# Patient Record
Sex: Female | Born: 1965
Health system: Southern US, Community
[De-identification: ages and names within clinical notes are randomized; demographics above are authoritative.]

## PROBLEM LIST (undated history)

## (undated) DIAGNOSIS — T8859XA Other complications of anesthesia, initial encounter: Secondary | ICD-10-CM

## (undated) DIAGNOSIS — F419 Anxiety disorder, unspecified: Secondary | ICD-10-CM

## (undated) DIAGNOSIS — I10 Essential (primary) hypertension: Secondary | ICD-10-CM

## (undated) DIAGNOSIS — T7840XA Allergy, unspecified, initial encounter: Secondary | ICD-10-CM

## (undated) DIAGNOSIS — Z1509 Genetic susceptibility to other malignant neoplasm: Secondary | ICD-10-CM

## (undated) DIAGNOSIS — T4145XA Adverse effect of unspecified anesthetic, initial encounter: Secondary | ICD-10-CM

## (undated) DIAGNOSIS — K219 Gastro-esophageal reflux disease without esophagitis: Secondary | ICD-10-CM

## (undated) DIAGNOSIS — R51 Headache: Secondary | ICD-10-CM

## (undated) DIAGNOSIS — Z8041 Family history of malignant neoplasm of ovary: Secondary | ICD-10-CM

## (undated) DIAGNOSIS — R519 Headache, unspecified: Secondary | ICD-10-CM

## (undated) DIAGNOSIS — M1990 Unspecified osteoarthritis, unspecified site: Secondary | ICD-10-CM

## (undated) HISTORY — DX: Family history of malignant neoplasm of ovary: Z80.41

## (undated) HISTORY — DX: Gastro-esophageal reflux disease without esophagitis: K21.9

## (undated) HISTORY — PX: GALLBLADDER SURGERY: SHX652

## (undated) HISTORY — DX: Allergy, unspecified, initial encounter: T78.40XA

## (undated) HISTORY — DX: Genetic susceptibility to other malignant neoplasm: Z15.09

---
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
## 2007-10-19 ENCOUNTER — Ambulatory Visit: Payer: Self-pay | Admitting: Family Medicine

## 2007-10-31 ENCOUNTER — Ambulatory Visit: Payer: Self-pay | Admitting: Family Medicine

## 2011-02-25 ENCOUNTER — Encounter (INDEPENDENT_AMBULATORY_CARE_PROVIDER_SITE_OTHER): Payer: BC Managed Care – PPO | Admitting: Ophthalmology

## 2011-02-25 DIAGNOSIS — H43819 Vitreous degeneration, unspecified eye: Secondary | ICD-10-CM

## 2011-02-25 DIAGNOSIS — H3530 Unspecified macular degeneration: Secondary | ICD-10-CM

## 2016-02-11 ENCOUNTER — Encounter: Payer: Self-pay | Admitting: Family Medicine

## 2016-03-23 ENCOUNTER — Encounter: Payer: Self-pay | Admitting: Family Medicine

## 2016-04-07 ENCOUNTER — Encounter: Payer: Self-pay | Admitting: Family Medicine

## 2016-05-25 ENCOUNTER — Encounter: Payer: Self-pay | Admitting: Emergency Medicine

## 2016-05-25 ENCOUNTER — Emergency Department: Payer: Commercial Managed Care - HMO

## 2016-05-25 ENCOUNTER — Other Ambulatory Visit: Payer: Self-pay | Admitting: Radiology

## 2016-05-25 ENCOUNTER — Emergency Department
Admission: EM | Admit: 2016-05-25 | Discharge: 2016-05-25 | Disposition: A | Discharge status: Home / Self Care | Payer: Commercial Managed Care - HMO | Attending: Student in an Organized Health Care Education/Training Program | Admitting: Student in an Organized Health Care Education/Training Program

## 2016-05-25 DIAGNOSIS — R1011 Right upper quadrant pain: Secondary | ICD-10-CM | POA: Diagnosis present

## 2016-05-25 DIAGNOSIS — K8020 Calculus of gallbladder without cholecystitis without obstruction: Secondary | ICD-10-CM | POA: Diagnosis not present

## 2016-05-25 LAB — CBC WITH DIFFERENTIAL/PLATELET
BASOS ABS: 0.1 10*3/uL (ref 0–0.1)
BASOS PCT: 0 %
EOS PCT: 0 %
Eosinophils Absolute: 0 10*3/uL (ref 0–0.7)
HCT: 39.1 % (ref 35.0–47.0)
Hemoglobin: 13.4 g/dL (ref 12.0–16.0)
LYMPHS PCT: 6 %
Lymphs Abs: 1 10*3/uL (ref 1.0–3.6)
MCH: 29.6 pg (ref 26.0–34.0)
MCHC: 34.2 g/dL (ref 32.0–36.0)
MCV: 86.4 fL (ref 80.0–100.0)
Monocytes Absolute: 0.4 10*3/uL (ref 0.2–0.9)
Monocytes Relative: 2 %
NEUTROS ABS: 15.6 10*3/uL — AB (ref 1.4–6.5)
Neutrophils Relative %: 92 %
PLATELETS: 261 10*3/uL (ref 150–440)
RBC: 4.52 MIL/uL (ref 3.80–5.20)
RDW: 14.1 % (ref 11.5–14.5)
WBC: 17.1 10*3/uL — AB (ref 3.6–11.0)

## 2016-05-25 LAB — URINALYSIS, COMPLETE (UACMP) WITH MICROSCOPIC
Bilirubin Urine: NEGATIVE
GLUCOSE, UA: NEGATIVE mg/dL
KETONES UR: 20 mg/dL — AB
LEUKOCYTES UA: NEGATIVE
NITRITE: NEGATIVE
PROTEIN: NEGATIVE mg/dL
Specific Gravity, Urine: 1.01 (ref 1.005–1.030)
pH: 6 (ref 5.0–8.0)

## 2016-05-25 LAB — COMPREHENSIVE METABOLIC PANEL
ALT: 15 U/L (ref 14–54)
ANION GAP: 11 (ref 5–15)
AST: 20 U/L (ref 15–41)
Albumin: 3.7 g/dL (ref 3.5–5.0)
Alkaline Phosphatase: 72 U/L (ref 38–126)
BUN: <5 mg/dL — ABNORMAL LOW (ref 6–20)
CHLORIDE: 96 mmol/L — AB (ref 101–111)
CO2: 24 mmol/L (ref 22–32)
Calcium: 9 mg/dL (ref 8.9–10.3)
Creatinine, Ser: 0.5 mg/dL (ref 0.44–1.00)
GFR calc non Af Amer: >60 mL/min (ref >60)
Glucose, Bld: 156 mg/dL — ABNORMAL HIGH (ref 65–99)
POTASSIUM: 3.3 mmol/L — AB (ref 3.5–5.1)
SODIUM: 131 mmol/L — AB (ref 135–145)
Total Bilirubin: 0.7 mg/dL (ref 0.3–1.2)
Total Protein: 8.2 g/dL — ABNORMAL HIGH (ref 6.5–8.1)

## 2016-05-25 LAB — LIPASE, BLOOD: Lipase: 15 U/L (ref 11–51)

## 2016-05-25 MED ORDER — MORPHINE SULFATE (PF) 4 MG/ML IV SOLN
4.0000 mg | INTRAVENOUS | Status: DC | PRN
Start: 2016-05-25 — End: 2016-05-25
  Filled 2016-05-25: qty 1

## 2016-05-25 MED ORDER — PROMETHAZINE HCL 25 MG/ML IJ SOLN
12.5000 mg | Freq: Once | INTRAMUSCULAR | Status: DC
  Filled 2016-05-25: qty 1

## 2016-05-25 MED ORDER — SODIUM CHLORIDE 0.9 % IV BOLUS (SEPSIS)
1000.0000 mL | Freq: Once | INTRAVENOUS | Status: AC
  Administered 2016-05-25: 1000 mL via INTRAVENOUS

## 2016-05-25 MED ORDER — PROMETHAZINE HCL 12.5 MG PO TABS: 12.5000 mg | ORAL_TABLET | Freq: Four times a day (QID) | ORAL | 0 refills | Status: DC | PRN

## 2016-05-25 MED ORDER — DICYCLOMINE HCL 10 MG PO CAPS
10.0000 mg | ORAL_CAPSULE | Freq: Once | ORAL | Status: AC
  Administered 2016-05-25: 10 mg via ORAL
  Filled 2016-05-25: qty 1

## 2016-05-25 MED ORDER — DICYCLOMINE HCL 10 MG PO CAPS: 10.0000 mg | ORAL_CAPSULE | Freq: Three times a day (TID) | ORAL | 0 refills | Status: DC | PRN

## 2016-05-25 MED ORDER — HYDROCODONE-ACETAMINOPHEN 5-325 MG PO TABS: 1.0000 | ORAL_TABLET | ORAL | 0 refills | Status: DC | PRN

## 2016-05-25 NOTE — ED Notes (Signed)
Pt ambulatory to toilet with no assistance to urinate.

## 2016-05-25 NOTE — ED Notes (Signed)
Pt given crackers, peanut butter, and water for PO challenge.

## 2016-05-25 NOTE — ED Notes (Signed)
Pt states RUQ pain since midnight. States N&V but no diarrhea. States not keeping anything down, was seen at [facility] and sent here. Pt states she has gallbladder and pancreas, but no appendix. Describes pain as wrapping around R breast to back. Pt is alert and oriented, no distress noted. States UC gave pain medicine and phenergan.

## 2016-05-25 NOTE — ED Notes (Signed)
Pt denied wanting pain or nausea medicine at this time since she received some at [facility].

## 2016-05-25 NOTE — ED Notes (Signed)
Pt taken to US via stretcher

## 2016-05-25 NOTE — ED Provider Notes (Signed)
[facility] Emergency Department Provider Note    First MD Initiated Contact with Patient 05/25/16 1639     (approximate)  I have reviewed the triage vital signs and the nursing notes.   HISTORY  Chief Complaint Abdominal Pain    HPI Hailey Sheppard is a 50 y.o. female no significant past medical history presents with acute right upper quadrant pain that started around noon today after she ate several sausages for lunch. The pain she rated as 10 out 10 in severity starting just under her right ribs and radiating to her back. There is no associated shortness of breath or chest pain. States that she's vomited innumerable times. No blood or bile in her vomit. States that she's never had this pain before. Is not having any abdominal surgeries. Denies any diarrhea or constipation. No history of kidney stones. Denies any hematuria or dysuria. She is not on any blood thinners. Does have a family history of cholecystitis with lithiasis on her mother's side.   History reviewed. No pertinent past medical history. History reviewed. No pertinent family history. No previous surgeries There are no active problems to display for this patient.     Prior to Admission medications   Medication Sig Start Date End Date Taking? Authorizing Provider  montelukast (SINGULAIR) 10 MG tablet Take 10 mg by mouth daily as needed.   Yes Historical Provider, MD  PSEUDOEPHEDRINE HCL PO Take 1 tablet by mouth daily.   Yes Historical Provider, MD  dicyclomine (BENTYL) 10 MG capsule Take 1 capsule (10 mg total) by mouth 3 (three) times daily as needed for spasms. 05/25/16 06/08/16  Merlyn Lot, MD  HYDROcodone-acetaminophen (NORCO) 5-325 MG tablet Take 1 tablet by mouth every 4 (four) hours as needed for moderate pain. 05/25/16   Merlyn Lot, MD  promethazine (PHENERGAN) 12.5 MG tablet Take 1 tablet (12.5 mg total) by mouth every 6 (six) hours as needed for nausea or vomiting. 05/25/16    Merlyn Lot, MD    Allergies Erythromycin    Social History Social History  Substance Use Topics  . Smoking status: Not on file  . Smokeless tobacco: Not on file  . Alcohol use Not on file    Review of Systems Patient denies headaches, rhinorrhea, blurry vision, numbness, shortness of breath, chest pain, edema, cough, abdominal pain, nausea, vomiting, diarrhea, dysuria, fevers, rashes or hallucinations unless otherwise stated above in HPI. ____________________________________________   PHYSICAL EXAM:  VITAL SIGNS: Vitals:   05/25/16 1800 05/25/16 1830  BP: 135/75 (!) 141/80  Pulse: 69 85  Resp: 20 19  Temp:      Constitutional: Alert and oriented.in no acute distress. Eyes: Conjunctivae are normal. PERRL. EOMI. Head: Atraumatic. Nose: No congestion/rhinnorhea. Mouth/Throat: Mucous membranes are moist.  Oropharynx non-erythematous. Neck: No stridor. Painless ROM. No cervical spine tenderness to palpation Hematological/Lymphatic/Immunilogical: No cervical lymphadenopathy. Cardiovascular: Normal rate, regular rhythm. Grossly normal heart sounds.  Good peripheral circulation. Respiratory: Normal respiratory effort.  No retractions. Lungs CTAB. Gastrointestinal: Soft with RUQ ttp, no guarding. No distention. No abdominal bruits. No CVA tenderness. Musculoskeletal: No lower extremity tenderness nor edema.  No joint effusions. Neurologic:  Normal speech and language. No gross focal neurologic deficits are appreciated. No gait instability. Skin:  Skin is warm, dry and intact. No rash noted. Psychiatric: Mood and affect are normal. Speech and behavior are normal.  ____________________________________________   LABS (all labs ordered are listed, but only abnormal results are displayed)  Results for orders placed or performed  during the hospital encounter of 05/25/16 (from the past 24 hour(s))  CBC with Differential/Platelet     Status: Abnormal   Collection Time:  05/25/16  4:56 PM  Result Value Ref Range   WBC 17.1 (H) 3.6 - 11.0 K/uL   RBC 4.52 3.80 - 5.20 MIL/uL   Hemoglobin 13.4 12.0 - 16.0 g/dL   HCT 39.1 35.0 - 47.0 %   MCV 86.4 80.0 - 100.0 fL   MCH 29.6 26.0 - 34.0 pg   MCHC 34.2 32.0 - 36.0 g/dL   RDW 14.1 11.5 - 14.5 %   Platelets 261 150 - 440 K/uL   Neutrophils Relative % 92 %   Neutro Abs 15.6 (H) 1.4 - 6.5 K/uL   Lymphocytes Relative 6 %   Lymphs Abs 1.0 1.0 - 3.6 K/uL   Monocytes Relative 2 %   Monocytes Absolute 0.4 0.2 - 0.9 K/uL   Eosinophils Relative 0 %   Eosinophils Absolute 0.0 0 - 0.7 K/uL   Basophils Relative 0 %   Basophils Absolute 0.1 0 - 0.1 K/uL  Comprehensive metabolic panel     Status: Abnormal   Collection Time: 05/25/16  4:56 PM  Result Value Ref Range   Sodium 131 (L) 135 - 145 mmol/L   Potassium 3.3 (L) 3.5 - 5.1 mmol/L   Chloride 96 (L) 101 - 111 mmol/L   CO2 24 22 - 32 mmol/L   Glucose, Bld 156 (H) 65 - 99 mg/dL   BUN <5 (L) 6 - 20 mg/dL   Creatinine, Ser 0.50 0.44 - 1.00 mg/dL   Calcium 9.0 8.9 - 10.3 mg/dL   Total Protein 8.2 (H) 6.5 - 8.1 g/dL   Albumin 3.7 3.5 - 5.0 g/dL   AST 20 15 - 41 U/L   ALT 15 14 - 54 U/L   Alkaline Phosphatase 72 38 - 126 U/L   Total Bilirubin 0.7 0.3 - 1.2 mg/dL   GFR calc non Af Amer >60 >60 mL/min   GFR calc Af Amer >60 >60 mL/min   Anion gap 11 5 - 15  Lipase, blood     Status: None   Collection Time: 05/25/16  4:56 PM  Result Value Ref Range   Lipase 15 11 - 51 U/L  Urinalysis, Complete w Microscopic     Status: Abnormal   Collection Time: 05/25/16  4:56 PM  Result Value Ref Range   Color, Urine YELLOW (A) YELLOW   APPearance CLEAR (A) CLEAR   Specific Gravity, Urine 1.010 1.005 - 1.030   pH 6.0 5.0 - 8.0   Glucose, UA NEGATIVE NEGATIVE mg/dL   Hgb urine dipstick SMALL (A) NEGATIVE   Bilirubin Urine NEGATIVE NEGATIVE   Ketones, ur 20 (A) NEGATIVE mg/dL   Protein, ur NEGATIVE NEGATIVE mg/dL   Nitrite NEGATIVE NEGATIVE   Leukocytes, UA NEGATIVE  NEGATIVE   RBC / HPF 0-5 0 - 5 RBC/hpf   WBC, UA 0-5 0 - 5 WBC/hpf   Bacteria, UA RARE (A) NONE SEEN   Squamous Epithelial / LPF 0-5 (A) NONE SEEN   Mucous PRESENT    ____________________________________________  EKG My review and personal interpretation at Time: 16:39   Indication: ruq pain  Rate: 70  Rhythm: sinus Axis: normal Other: non specific t wave changes, no STEMI ____________________________________________  RADIOLOGY  I personally reviewed all radiographic images ordered to evaluate for the above acute complaints and reviewed radiology reports and findings.  These findings were personally discussed with the patient.  Please  see medical record for radiology report.  ____________________________________________   PROCEDURES  Procedure(s) performed:  Procedures    Critical Care performed: no ____________________________________________   INITIAL IMPRESSION / ASSESSMENT AND PLAN / ED COURSE  Pertinent labs & imaging results that were available during my care of the patient were reviewed by me and considered in my medical decision making (see chart for details).  DDX: cholelithiasis, cholecystitis, biliary colic, enteritis, pancreatitis  Hailey Sheppard is a 50 y.o. who presents to the ED with acute right upper quadrant pain with nausea and vomiting. Presentation is concerning for hepatobiliary pathology therefore right upper quadrant ultrasound be ordered. EKG shows no evidence of acute ischemia. Her pain is now moderately controlled after receiving pain medication from [facility]. We'll provide IV fluids for dehydration as well as check labs for any evidence of biliary obstructive pathology.  Clinical Course as of May 25 1957  Mon May 25, 2016  1737 Blood work is fairly reassuring. Patient does have a mild leukocytosis but is afebrile. Patient reassessed and her abdominal exam is benign at this time. Ultrasound does show evidence of cholelithiasis without evidence of  acute cholecystitis. Her LFTs and hemodynamically reviewed and are normal. I reviewed the case with Dr. Burt Knack of general surgery. He is recommended additional symptomatically control. If able to tolerate oral hydration and pain is controlled patient can be seen in clinic early this week for possible operative planning. Do suspect she is having some component of biliary colic. We'll provide additional oral analgesia and PO challenge.  [PR]  1851 Patient tolerating PO.  In no discomfort at this time.  Patient was able to tolerate PO and was able to ambulate with a steady gait.   [PR]  [id number] Patient requesting discharge home. Patient remains in no acute distress. Does have some ketones on urinalysis likely secondary to nausea vomiting and component of dehydration. Remains afebrile. She is tolerating hydration and food. She denies any pain or nausea at this time. Will discharge with strict return precautions, follow up with Dr. Antionette Char clinic as well as antiemetics and pain medication.  [PR]    Clinical Course User Index [PR] Merlyn Lot, MD     ____________________________________________   FINAL CLINICAL IMPRESSION(S) / ED DIAGNOSES  Final diagnoses:  RUQ abdominal pain  Calculus of gallbladder without cholecystitis without obstruction      NEW MEDICATIONS STARTED DURING THIS VISIT:  Discharge Medication List as of 05/25/2016  6:58 PM    START taking these medications   Details  dicyclomine (BENTYL) 10 MG capsule Take 1 capsule (10 mg total) by mouth 3 (three) times daily as needed for spasms., Starting Mon 05/25/2016, Until Mon 06/08/2016, Print    HYDROcodone-acetaminophen (NORCO) 5-325 MG tablet Take 1 tablet by mouth every 4 (four) hours as needed for moderate pain., Starting Mon 05/25/2016, Print    promethazine (PHENERGAN) 12.5 MG tablet Take 1 tablet (12.5 mg total) by mouth every 6 (six) hours as needed for nausea or vomiting., Starting Mon 05/25/2016, Print          Note:  This document was prepared using Dragon voice recognition software and may include unintentional dictation errors.    Merlyn Lot, MD 05/25/16 [phone]

## 2016-05-25 NOTE — ED Triage Notes (Signed)
States upper right abd pain that began at 12 am, states pain that radiates to her back, states nausea and vomiting, pt awake and alert, was sent from urgent care

## 2016-05-27 DIAGNOSIS — T7840XA Allergy, unspecified, initial encounter: Secondary | ICD-10-CM | POA: Insufficient documentation

## 2016-05-27 DIAGNOSIS — K219 Gastro-esophageal reflux disease without esophagitis: Secondary | ICD-10-CM | POA: Insufficient documentation

## 2016-05-29 ENCOUNTER — Ambulatory Visit (INDEPENDENT_AMBULATORY_CARE_PROVIDER_SITE_OTHER): Payer: Commercial Managed Care - HMO | Admitting: Surgery

## 2016-05-29 ENCOUNTER — Encounter: Payer: Self-pay | Admitting: Surgery

## 2016-05-29 VITALS — BP 159/100 | HR 98 | Temp 99.0°F | Ht 62.0 in | Wt 229.2 lb

## 2016-05-29 DIAGNOSIS — K8050 Calculus of bile duct without cholangitis or cholecystitis without obstruction: Secondary | ICD-10-CM | POA: Diagnosis not present

## 2016-05-29 NOTE — Patient Instructions (Addendum)
You have requested to have your gallbladder removed. We will arrange for this to be done on 06/17/16 at [facility] with Dr. Burt Knack.  You will most likely be out of work 1-2 weeks for this surgery. You will return after your post-op appointment with a lifting restriction for approximately 4 more weeks.  You will be able to eat anything you would like to following surgery. But, start by eating a bland diet and advance this as tolerated.  Please see the (blue)pre-care form that you have been given today. If you have any questions, please call our office.  Laparoscopic Cholecystectomy Laparoscopic cholecystectomy is surgery to remove the gallbladder. The gallbladder is located in the upper right part of the abdomen, behind the liver. It is a storage sac for bile, which is produced in the liver. Bile aids in the digestion and absorption of fats. Cholecystectomy is often done for inflammation of the gallbladder (cholecystitis). This condition is usually caused by a buildup of gallstones (cholelithiasis) in the gallbladder. Gallstones can block the flow of bile, and that can result in inflammation and pain. In severe cases, emergency surgery may be required. If emergency surgery is not required, you will have time to prepare for the procedure. Laparoscopic surgery is an alternative to open surgery. Laparoscopic surgery has a shorter recovery time. Your common bile duct may also need to be examined during the procedure. If stones are found in the common bile duct, they may be removed. LET [facility] CARE PROVIDER KNOW ABOUT:  Any allergies you have.  All medicines you are taking, including vitamins, herbs, eye drops, creams, and over-the-counter medicines.  Previous problems you or members of your family have had with the use of anesthetics.  Any blood disorders you have.  Previous surgeries you have had.    Any medical conditions you have. RISKS AND COMPLICATIONS Generally, this is a safe  procedure. However, problems may occur, including:  Infection.  Bleeding.  Allergic reactions to medicines.  Damage to other structures or organs.  A stone remaining in the common bile duct.  A bile leak from the cyst duct that is clipped when your gallbladder is removed.  The need to convert to open surgery, which requires a larger incision in the abdomen. This may be necessary if your surgeon thinks that it is not safe to continue with a laparoscopic procedure. BEFORE THE PROCEDURE  Ask your health care provider about:  Changing or stopping your regular medicines. This is especially important if you are taking diabetes medicines or blood thinners.  Taking medicines such as aspirin and ibuprofen. These medicines can thin your blood. Do not take these medicines before your procedure if your health care provider instructs you not to.  Follow instructions from your health care provider about eating or drinking restrictions.  Let your health care provider know if you develop a cold or an infection before surgery.  Plan to have someone take you home after the procedure.  Ask your health care provider how your surgical site will be marked or identified.  You may be given antibiotic medicine to help prevent infection. PROCEDURE  To reduce your risk of infection:  Your health care team will wash or sanitize their hands.  Your skin will be washed with soap.  An IV tube may be inserted into one of your veins.  You will be given a medicine to make you fall asleep (general anesthetic).  A breathing tube will be placed in your mouth.  The surgeon  will make several small cuts (incisions) in your abdomen.  A thin, lighted tube (laparoscope) that has a tiny camera on the end will be inserted through one of the small incisions. The camera on the laparoscope will send a picture to a TV screen (monitor) in the operating room. This will give the surgeon a good view inside your  abdomen.  A gas will be pumped into your abdomen. This will expand your abdomen to give the surgeon more room to perform the surgery.  Other tools that are needed for the procedure will be inserted through the other incisions. The gallbladder will be removed through one of the incisions.  After your gallbladder has been removed, the incisions will be closed with stitches (sutures), staples, or skin glue.  Your incisions may be covered with a bandage (dressing). The procedure may vary among health care providers and hospitals. AFTER THE PROCEDURE  Your blood pressure, heart rate, breathing rate, and blood oxygen level will be monitored often until the medicines you were given have worn off.  You will be given medicines as needed to control your pain.   This information is not intended to replace advice given to you by your health care provider. Make sure you discuss any questions you have with your health care provider.   Document Released: 05/18/2005 Document Revised: 02/06/2015 Document Reviewed: 12/28/2012 Elsevier Interactive Patient Education 2016 [place] Diet for Pancreatitis or Gallbladder Conditions A low-fat diet can be helpful if you have pancreatitis or a gallbladder condition. With these conditions, your pancreas and gallbladder have trouble digesting fats. A healthy eating plan with less fat will help rest your pancreas and gallbladder and reduce your symptoms. WHAT DO I NEED TO KNOW ABOUT THIS DIET?  Eat a low-fat diet.  Reduce your fat intake to less than 20-30% of your total daily calories. This is less than 50-60 g of fat per day.  Remember that you need some fat in your diet. Ask your dietician what your daily goal should be.  Choose nonfat and low-fat healthy foods. Look for the words "nonfat," "low fat," or "fat free."  As a guide, look on the label and choose foods with less than 3 g of fat per serving. Eat only one serving.  Avoid  alcohol.  Do not smoke. If you need help quitting, talk with your health care provider.  Eat small frequent meals instead of three large heavy meals. WHAT FOODS CAN I EAT? Grains Include healthy grains and starches such as potatoes, wheat bread, fiber-rich cereal, and brown rice. Choose whole grain options whenever possible. In adults, whole grains should account for 45-65% of your daily calories.  Fruits and Vegetables Eat plenty of fruits and vegetables. Fresh fruits and vegetables add fiber to your diet. Meats and Other Protein Sources Eat lean meat such as chicken and pork. Trim any fat off of meat before cooking it. Eggs, fish, and beans are other sources of protein. In adults, these foods should account for 10-35% of your daily calories. Dairy Choose low-fat milk and dairy options. Dairy includes fat and protein, as well as calcium.  Fats and Oils Limit high-fat foods such as fried foods, sweets, baked goods, sugary drinks.  Other Creamy sauces and condiments, such as mayonnaise, can add extra fat. Think about whether or not you need to use them, or use smaller amounts or low fat options. WHAT FOODS ARE NOT RECOMMENDED?  High fat foods, such as:  [employer].  Ice  cream.  French toast.  Sweet rolls.  Pizza.  Cheese bread.  Foods covered with batter, butter, creamy sauces, or cheese.  Fried foods.  Sugary drinks and desserts.  Foods that cause gas or bloating   This information is not intended to replace advice given to you by your health care provider. Make sure you discuss any questions you have with your health care provider.   Document Released: 05/23/2013 Document Reviewed: 05/23/2013 Elsevier Interactive Patient Education [employer].

## 2016-05-29 NOTE — Progress Notes (Signed)
Surgical Consultation  05/29/2016  Hailey Sheppard is an 50 y.o. female.   CC: Right upper quadrant pain  HPI: This patient with a year urine half of epigastric and right upper quadrant pain associated fatty food intolerance believed to be reflux disease but now found that she has gallstones. This episode most recent was on Monday and has mostly abated. She had nausea and vomiting on Monday but none since no jaundice or acholic stools no fevers or chills  Past Medical History:  Diagnosis Date  . Allergy   . GERD (gastroesophageal reflux disease)     Past Surgical History:  Procedure Laterality Date  . APPENDECTOMY  1980   Open with Bowel Resection due to Perforation  . COLON SURGERY  1980   Bowel Resection during Appendectomy secondary to Perforation   She had a bowel resection possibly a right colon resection at the time of her ruptured appendix. She is not clears which portion of her bowel was removed however Family History  Problem Relation Age of Onset  . Diabetes Mother   . Heart disease Mother     Arrythmia (Unknown Type)  . Diabetes Father   . Gallbladder disease Brother   . Gallbladder disease Maternal Grandmother     Social History:  reports that she has never smoked. She has never used smokeless tobacco. She reports that she drinks alcohol. She reports that she does not use drugs. Patient is currently unemployed does not smoke or drink.  Allergies:  Allergies  Allergen Reactions  . Erythromycin     nausea/vomiting    Medications reviewed.   Review of Systems:   Review of Systems  Constitutional: Negative.   HENT: Negative.   Eyes: Negative.   Respiratory: Negative.   Cardiovascular: Negative.   Gastrointestinal: Positive for abdominal pain, heartburn, nausea and vomiting. Negative for blood in stool, constipation, diarrhea and melena.  Genitourinary: Negative.   Musculoskeletal: Negative.   Skin: Negative.   Neurological: Negative.    Endo/Heme/Allergies: Negative.   Psychiatric/Behavioral: Negative.      Physical Exam:  BP (!) 159/100   Pulse 98   Temp 99 F (37.2 C) (Oral)   Ht 5\' 2"  (1.575 m)   Wt 229 lb 3.2 oz (104 kg)   LMP 05/29/2016   BMI 41.92 kg/m   Physical Exam  Constitutional: She is oriented to person, place, and time and well-developed, well-nourished, and in no distress. No distress.  Morbidly obese in no acute distress morbidly obese in no acute distress  HENT:  Head: Normocephalic and atraumatic.  Eyes: Pupils are equal, round, and reactive to light. Right eye exhibits no discharge. Left eye exhibits no discharge. No scleral icterus.  Neck: Normal range of motion.  Cardiovascular: Normal rate, regular rhythm and normal heart sounds.   Pulmonary/Chest: Effort normal and breath sounds normal. No respiratory distress. She has no wheezes. She has no rales.  Abdominal: Soft. She exhibits no distension. There is no tenderness. There is no rebound and no guarding.  Minimal if any tenderness in the epigastrium and right upper quadrant with no Murphy sign  Musculoskeletal: Normal range of motion. She exhibits no edema or tenderness.  Lymphadenopathy:    She has no cervical adenopathy.  Neurological: She is alert and oriented to person, place, and time.  Skin: Skin is warm and dry. No rash noted. She is not diaphoretic. No erythema.  Psychiatric: Mood and affect normal.  Vitals reviewed.     No results found for this or  any previous visit (from the past 48 hour(s)). No results found.  Assessment/Plan:  Recurrent and episodic right upper quadrant pain with fatty food intolerance and workup showing gallstones. Recommend laparoscopic cholecystectomy. I discussed with her the rationale for offering this surgery the options of observation the risks of bleeding infection recurrence of symptoms failure to resolve her symptoms. With her prior colon resection possibly a right colon resection I discussed  how it may be difficult to gain access to abdominal cavity and the risk of an open procedure was reviewed as well as risk of bowel injury. We also discussed risk of bile duct damage bile duct leak or retained stone any of which could require further surgery and/or ERCP. She understood and agreed to proceed  Florene Glen, MD, FACS

## 2016-06-03 ENCOUNTER — Telehealth: Payer: Self-pay | Admitting: Surgery

## 2016-06-03 NOTE — Telephone Encounter (Signed)
Pt advised of pre op date/time and sx date. Sx: 06/17/16 with Dr Maeola Sarah cholecystectomy.  Pre op: 06/10/15 between 1-5:00pm--Phone.   Patient made aware to call [phone], between 1-3:00pm the day before surgery, to find out what time to arrive.

## 2016-06-09 ENCOUNTER — Encounter: Payer: Self-pay | Admitting: *Deleted

## 2016-06-09 NOTE — Patient Instructions (Signed)
  Your procedure is scheduled on: 06-17-16 [facility]) Report to Same Day Surgery 2nd floor medical mall [facility] Entrance-take elevator on left to 2nd floor.  Check in with surgery information desk.) To find out your arrival time please call [phone] between 1PM - 3PM on 06-16-16 (TUESDAY)  Remember: Instructions that are not followed completely may result in serious medical risk, up to and including death, or upon the discretion of your surgeon and anesthesiologist your surgery may need to be rescheduled.    _x___ 1. Do not eat food or drink liquids after midnight. No gum chewing or hard candies.     __x__ 2. No Alcohol for 24 hours before or after surgery.   __x__3. No Smoking for 24 prior to surgery.   ____  4. Bring all medications with you on the day of surgery if instructed.    __x__ 5. Notify your doctor if there is any change in your medical condition     (cold, fever, infections).     Do not wear jewelry, make-up, hairpins, clips or nail polish.  Do not wear lotions, powders, or perfumes. You may wear deodorant.  Do not shave 48 hours prior to surgery. Men may shave face and neck.  Do not bring valuables to the hospital.    [facility] is not responsible for any belongings or valuables.               Contacts, dentures or bridgework may not be worn into surgery.  Leave your suitcase in the car. After surgery it may be brought to your room.  For patients admitted to the hospital, discharge time is determined by your treatment team.   Patients discharged the day of surgery will not be allowed to drive home.  You will need someone to drive you home and stay with you the night of your procedure.    Please read over the following fact sheets that you were given:   [facility] Preparing for Surgery and or MRSA Information   _x___ Take these medicines the morning of surgery with A SIP OF WATER:    1. NEXIUM  2.  3.  4.  5.  6.  ____Fleets enema or Magnesium  Citrate as directed.   _x___ Use CHG Soap or sage wipes as directed on instruction sheet   ____ Use inhalers on the day of surgery and bring to hospital day of surgery  ____ Stop metformin 2 days prior to surgery    ____ Take 1/2 of usual insulin dose the night before surgery and none on the morning of  surgery.   ____ Stop Aspirin, Coumadin, Pllavix ,Eliquis, Effient, or Pradaxa  x__ Stop Anti-inflammatories such as Advil, Aleve, Ibuprofen, Motrin, Naproxen,          Naprosyn, Goodies powders or aspirin products NOW-Ok to take Tylenol.   ____ Stop supplements until after surgery.    ____ Bring C-Pap to the hospital.

## 2016-06-10 ENCOUNTER — Encounter
Admission: RE | Admit: 2016-06-10 | Discharge: 2016-06-10 | Disposition: A | Discharge status: Home / Self Care | Payer: Commercial Managed Care - HMO | Source: Ambulatory Visit | Attending: Surgery | Admitting: Surgery

## 2016-06-10 DIAGNOSIS — K219 Gastro-esophageal reflux disease without esophagitis: Secondary | ICD-10-CM | POA: Diagnosis not present

## 2016-06-10 DIAGNOSIS — Z01812 Encounter for preprocedural laboratory examination: Secondary | ICD-10-CM | POA: Diagnosis not present

## 2016-06-10 DIAGNOSIS — K8080 Other cholelithiasis without obstruction: Secondary | ICD-10-CM | POA: Diagnosis not present

## 2016-06-10 DIAGNOSIS — Z833 Family history of diabetes mellitus: Secondary | ICD-10-CM | POA: Insufficient documentation

## 2016-06-10 DIAGNOSIS — Z8249 Family history of ischemic heart disease and other diseases of the circulatory system: Secondary | ICD-10-CM | POA: Insufficient documentation

## 2016-06-10 DIAGNOSIS — R1011 Right upper quadrant pain: Secondary | ICD-10-CM | POA: Insufficient documentation

## 2016-06-10 DIAGNOSIS — Z888 Allergy status to other drugs, medicaments and biological substances status: Secondary | ICD-10-CM | POA: Diagnosis not present

## 2016-06-10 DIAGNOSIS — Z9889 Other specified postprocedural states: Secondary | ICD-10-CM | POA: Diagnosis not present

## 2016-06-10 HISTORY — DX: Essential (primary) hypertension: I10

## 2016-06-10 HISTORY — DX: Anxiety disorder, unspecified: F41.9

## 2016-06-10 HISTORY — DX: Other complications of anesthesia, initial encounter: T88.59XA

## 2016-06-10 HISTORY — DX: Headache, unspecified: R51.9

## 2016-06-10 HISTORY — DX: Adverse effect of unspecified anesthetic, initial encounter: T41.45XA

## 2016-06-10 HISTORY — DX: Headache: R51

## 2016-06-10 LAB — COMPREHENSIVE METABOLIC PANEL
ALBUMIN: 3.3 g/dL — AB (ref 3.5–5.0)
ALK PHOS: 59 U/L (ref 38–126)
ALT: 14 U/L (ref 14–54)
ANION GAP: 8 (ref 5–15)
AST: 18 U/L (ref 15–41)
BUN: <5 mg/dL — ABNORMAL LOW (ref 6–20)
CALCIUM: 9 mg/dL (ref 8.9–10.3)
CHLORIDE: 104 mmol/L (ref 101–111)
CO2: 26 mmol/L (ref 22–32)
CREATININE: 0.68 mg/dL (ref 0.44–1.00)
GFR calc Af Amer: >60 mL/min (ref >60)
GFR calc non Af Amer: >60 mL/min (ref >60)
GLUCOSE: 114 mg/dL — AB (ref 65–99)
Potassium: 3.8 mmol/L (ref 3.5–5.1)
SODIUM: 138 mmol/L (ref 135–145)
Total Bilirubin: 0.3 mg/dL (ref 0.3–1.2)
Total Protein: 7.5 g/dL (ref 6.5–8.1)

## 2016-06-10 LAB — CBC WITH DIFFERENTIAL/PLATELET
BASOS PCT: 1 %
Basophils Absolute: 0.1 10*3/uL (ref 0–0.1)
EOS ABS: 0.1 10*3/uL (ref 0–0.7)
Eosinophils Relative: 2 %
HCT: 38 % (ref 35.0–47.0)
HEMOGLOBIN: 12.7 g/dL (ref 12.0–16.0)
Lymphocytes Relative: 22 %
Lymphs Abs: 1.4 10*3/uL (ref 1.0–3.6)
MCH: 28.8 pg (ref 26.0–34.0)
MCHC: 33.3 g/dL (ref 32.0–36.0)
MCV: 86.5 fL (ref 80.0–100.0)
Monocytes Absolute: 0.3 10*3/uL (ref 0.2–0.9)
Monocytes Relative: 5 %
NEUTROS PCT: 70 %
Neutro Abs: 4.6 10*3/uL (ref 1.4–6.5)
Platelets: 327 10*3/uL (ref 150–440)
RBC: 4.39 MIL/uL (ref 3.80–5.20)
RDW: 14.1 % (ref 11.5–14.5)
WBC: 6.6 10*3/uL (ref 3.6–11.0)

## 2016-06-17 ENCOUNTER — Ambulatory Visit: Payer: Commercial Managed Care - HMO | Admitting: Anesthesiology

## 2016-06-17 ENCOUNTER — Observation Stay
Admission: RE | Admit: 2016-06-17 | Discharge: 2016-06-18 | Disposition: A | Discharge status: Home / Self Care | Payer: Commercial Managed Care - HMO | Source: Ambulatory Visit | Attending: Surgery | Admitting: Surgery

## 2016-06-17 ENCOUNTER — Encounter: Payer: Self-pay | Admitting: *Deleted

## 2016-06-17 ENCOUNTER — Encounter
Admission: RE | Disposition: A | Discharge status: Home / Self Care | Payer: Self-pay | Source: Ambulatory Visit | Attending: Surgery

## 2016-06-17 DIAGNOSIS — K219 Gastro-esophageal reflux disease without esophagitis: Secondary | ICD-10-CM | POA: Insufficient documentation

## 2016-06-17 DIAGNOSIS — Z881 Allergy status to other antibiotic agents status: Secondary | ICD-10-CM | POA: Insufficient documentation

## 2016-06-17 DIAGNOSIS — K8050 Calculus of bile duct without cholangitis or cholecystitis without obstruction: Secondary | ICD-10-CM | POA: Diagnosis present

## 2016-06-17 DIAGNOSIS — I10 Essential (primary) hypertension: Secondary | ICD-10-CM | POA: Diagnosis not present

## 2016-06-17 DIAGNOSIS — K8064 Calculus of gallbladder and bile duct with chronic cholecystitis without obstruction: Principal | ICD-10-CM | POA: Insufficient documentation

## 2016-06-17 DIAGNOSIS — K8010 Calculus of gallbladder with chronic cholecystitis without obstruction: Secondary | ICD-10-CM | POA: Diagnosis not present

## 2016-06-17 HISTORY — PX: CHOLECYSTECTOMY: SHX55

## 2016-06-17 LAB — CREATININE, SERUM: CREATININE: 0.8 mg/dL (ref 0.44–1.00)

## 2016-06-17 LAB — CBC
HCT: 37.2 % (ref 35.0–47.0)
Hemoglobin: 12.4 g/dL (ref 12.0–16.0)
MCH: 28.9 pg (ref 26.0–34.0)
MCHC: 33.3 g/dL (ref 32.0–36.0)
MCV: 86.8 fL (ref 80.0–100.0)
PLATELETS: 245 10*3/uL (ref 150–440)
RBC: 4.28 MIL/uL (ref 3.80–5.20)
RDW: 14.6 % — AB (ref 11.5–14.5)
WBC: 14.2 10*3/uL — AB (ref 3.6–11.0)

## 2016-06-17 LAB — POCT PREGNANCY, URINE: Preg Test, Ur: NEGATIVE

## 2016-06-17 SURGERY — LAPAROSCOPIC CHOLECYSTECTOMY
Anesthesia: General | Wound class: Clean Contaminated

## 2016-06-17 MED ORDER — BUPIVACAINE-EPINEPHRINE (PF) 0.25% -1:200000 IJ SOLN
INTRAMUSCULAR | Status: AC
  Filled 2016-06-17: qty 30

## 2016-06-17 MED ORDER — ONDANSETRON HCL 4 MG/2ML IJ SOLN: 4.0000 mg | Freq: Four times a day (QID) | INTRAMUSCULAR | Status: DC | PRN

## 2016-06-17 MED ORDER — FENTANYL CITRATE (PF) 100 MCG/2ML IJ SOLN
INTRAMUSCULAR | Status: AC
  Filled 2016-06-17: qty 2

## 2016-06-17 MED ORDER — FENTANYL CITRATE (PF) 100 MCG/2ML IJ SOLN
INTRAMUSCULAR | Status: DC | PRN
  Administered 2016-06-17: 50 ug via INTRAVENOUS
  Administered 2016-06-17: 100 ug via INTRAVENOUS
  Administered 2016-06-17: 50 ug via INTRAVENOUS

## 2016-06-17 MED ORDER — ONDANSETRON HCL 4 MG/2ML IJ SOLN
INTRAMUSCULAR | Status: AC
  Filled 2016-06-17: qty 2

## 2016-06-17 MED ORDER — FENTANYL CITRATE (PF) 100 MCG/2ML IJ SOLN
INTRAMUSCULAR | Status: AC
  Administered 2016-06-17: 25 ug via INTRAVENOUS
  Filled 2016-06-17: qty 2

## 2016-06-17 MED ORDER — CHLORHEXIDINE GLUCONATE CLOTH 2 % EX PADS: 6.0000 | MEDICATED_PAD | Freq: Once | CUTANEOUS | Status: DC

## 2016-06-17 MED ORDER — FLUTICASONE PROPIONATE 50 MCG/ACT NA SUSP
1.0000 | Freq: Every day | NASAL | Status: DC
  Filled 2016-06-17: qty 16

## 2016-06-17 MED ORDER — CEFAZOLIN SODIUM-DEXTROSE 2-4 GM/100ML-% IV SOLN
INTRAVENOUS | Status: AC
  Administered 2016-06-17: 09:00:00
  Filled 2016-06-17: qty 100

## 2016-06-17 MED ORDER — BUPIVACAINE-EPINEPHRINE (PF) 0.25% -1:200000 IJ SOLN
INTRAMUSCULAR | Status: DC | PRN
  Administered 2016-06-17: 30 mL via PERINEURAL

## 2016-06-17 MED ORDER — HYDROCODONE-ACETAMINOPHEN 5-325 MG PO TABS: 1.0000 | ORAL_TABLET | ORAL | 0 refills | Status: DC | PRN

## 2016-06-17 MED ORDER — LIDOCAINE HCL (CARDIAC) 20 MG/ML IV SOLN
INTRAVENOUS | Status: DC | PRN
  Administered 2016-06-17: 50 mg via INTRAVENOUS

## 2016-06-17 MED ORDER — PANTOPRAZOLE SODIUM 40 MG PO TBEC
40.0000 mg | DELAYED_RELEASE_TABLET | Freq: Every day | ORAL | Status: DC
  Administered 2016-06-18: 40 mg via ORAL
  Filled 2016-06-17: qty 1

## 2016-06-17 MED ORDER — DEXAMETHASONE SODIUM PHOSPHATE 10 MG/ML IJ SOLN
INTRAMUSCULAR | Status: AC
  Filled 2016-06-17: qty 1

## 2016-06-17 MED ORDER — CEFAZOLIN SODIUM-DEXTROSE 2-4 GM/100ML-% IV SOLN: 2.0000 g | INTRAVENOUS | Status: DC

## 2016-06-17 MED ORDER — PROPOFOL 10 MG/ML IV BOLUS
INTRAVENOUS | Status: DC | PRN
  Administered 2016-06-17: 180 mg via INTRAVENOUS

## 2016-06-17 MED ORDER — MORPHINE SULFATE (PF) 4 MG/ML IV SOLN: 2.0000 mg | INTRAVENOUS | Status: DC | PRN

## 2016-06-17 MED ORDER — ONDANSETRON HCL 4 MG PO TABS: 4.0000 mg | ORAL_TABLET | Freq: Four times a day (QID) | ORAL | Status: DC | PRN

## 2016-06-17 MED ORDER — ONDANSETRON HCL 4 MG/2ML IJ SOLN
INTRAMUSCULAR | Status: DC | PRN
  Administered 2016-06-17: 4 mg via INTRAVENOUS

## 2016-06-17 MED ORDER — ROCURONIUM BROMIDE 50 MG/5ML IV SOSY
PREFILLED_SYRINGE | INTRAVENOUS | Status: AC
  Filled 2016-06-17: qty 5

## 2016-06-17 MED ORDER — ONDANSETRON HCL 4 MG/2ML IJ SOLN: 4.0000 mg | Freq: Once | INTRAMUSCULAR | Status: DC | PRN

## 2016-06-17 MED ORDER — DEXAMETHASONE SODIUM PHOSPHATE 10 MG/ML IJ SOLN
INTRAMUSCULAR | Status: DC | PRN
  Administered 2016-06-17: 10 mg via INTRAVENOUS

## 2016-06-17 MED ORDER — ROCURONIUM BROMIDE 100 MG/10ML IV SOLN
INTRAVENOUS | Status: DC | PRN
  Administered 2016-06-17: 10 mg via INTRAVENOUS
  Administered 2016-06-17: 30 mg via INTRAVENOUS

## 2016-06-17 MED ORDER — SUGAMMADEX SODIUM 500 MG/5ML IV SOLN
INTRAVENOUS | Status: AC
  Filled 2016-06-17: qty 5

## 2016-06-17 MED ORDER — MONTELUKAST SODIUM 10 MG PO TABS: 10.0000 mg | ORAL_TABLET | Freq: Every day | ORAL | Status: DC | PRN

## 2016-06-17 MED ORDER — LORATADINE 10 MG PO TABS: 10.0000 mg | ORAL_TABLET | Freq: Every day | ORAL | Status: DC | PRN

## 2016-06-17 MED ORDER — KETOROLAC TROMETHAMINE 30 MG/ML IJ SOLN
INTRAMUSCULAR | Status: AC
  Filled 2016-06-17: qty 1

## 2016-06-17 MED ORDER — ACETAMINOPHEN 10 MG/ML IV SOLN
INTRAVENOUS | Status: AC
  Filled 2016-06-17: qty 100

## 2016-06-17 MED ORDER — HEPARIN SODIUM (PORCINE) 5000 UNIT/ML IJ SOLN
INTRAMUSCULAR | Status: AC
  Administered 2016-06-17: 5000 [IU] via SUBCUTANEOUS
  Filled 2016-06-17: qty 1

## 2016-06-17 MED ORDER — MIDAZOLAM HCL 2 MG/2ML IJ SOLN
INTRAMUSCULAR | Status: DC | PRN
  Administered 2016-06-17: 2 mg via INTRAVENOUS

## 2016-06-17 MED ORDER — MIDAZOLAM HCL 2 MG/2ML IJ SOLN
INTRAMUSCULAR | Status: AC
  Filled 2016-06-17: qty 2

## 2016-06-17 MED ORDER — PROPOFOL 10 MG/ML IV BOLUS
INTRAVENOUS | Status: AC
  Filled 2016-06-17: qty 20

## 2016-06-17 MED ORDER — SUCCINYLCHOLINE CHLORIDE 20 MG/ML IJ SOLN
INTRAMUSCULAR | Status: DC | PRN
  Administered 2016-06-17: 120 mg via INTRAVENOUS

## 2016-06-17 MED ORDER — FENTANYL CITRATE (PF) 100 MCG/2ML IJ SOLN
25.0000 ug | INTRAMUSCULAR | Status: AC | PRN
  Administered 2016-06-17 (×6): 25 ug via INTRAVENOUS

## 2016-06-17 MED ORDER — LIDOCAINE HCL (PF) 2 % IJ SOLN
INTRAMUSCULAR | Status: AC
  Filled 2016-06-17: qty 2

## 2016-06-17 MED ORDER — LACTATED RINGERS IV SOLN
INTRAVENOUS | Status: DC
  Administered 2016-06-17 – 2016-06-18 (×3): via INTRAVENOUS

## 2016-06-17 MED ORDER — DICYCLOMINE HCL 10 MG PO CAPS: 10.0000 mg | ORAL_CAPSULE | Freq: Three times a day (TID) | ORAL | Status: DC | PRN

## 2016-06-17 MED ORDER — HEPARIN SODIUM (PORCINE) 5000 UNIT/ML IJ SOLN
5000.0000 [IU] | Freq: Once | INTRAMUSCULAR | Status: AC
  Administered 2016-06-17: 5000 [IU] via SUBCUTANEOUS

## 2016-06-17 MED ORDER — FENTANYL CITRATE (PF) 100 MCG/2ML IJ SOLN: 25.0000 ug | INTRAMUSCULAR | Status: DC | PRN

## 2016-06-17 MED ORDER — HEPARIN SODIUM (PORCINE) 5000 UNIT/ML IJ SOLN
5000.0000 [IU] | Freq: Three times a day (TID) | INTRAMUSCULAR | Status: DC
  Administered 2016-06-17 – 2016-06-18 (×4): 5000 [IU] via SUBCUTANEOUS
  Filled 2016-06-17 (×4): qty 1

## 2016-06-17 MED ORDER — SUGAMMADEX SODIUM 500 MG/5ML IV SOLN
INTRAVENOUS | Status: DC | PRN
  Administered 2016-06-17: 210 mg via INTRAVENOUS

## 2016-06-17 MED ORDER — HYDROCODONE-ACETAMINOPHEN 5-325 MG PO TABS
1.0000 | ORAL_TABLET | ORAL | Status: DC | PRN
  Administered 2016-06-17 – 2016-06-18 (×2): 1 via ORAL
  Filled 2016-06-17 (×2): qty 1

## 2016-06-17 MED ORDER — ACETAMINOPHEN 10 MG/ML IV SOLN
INTRAVENOUS | Status: DC | PRN
  Administered 2016-06-17: 1000 mg via INTRAVENOUS

## 2016-06-17 SURGICAL SUPPLY — 43 items
ADHESIVE MASTISOL STRL (MISCELLANEOUS) ×2 IMPLANT
APPLIER CLIP ROT 10 11.4 M/L (STAPLE) ×2
BLADE SURG SZ11 CARB STEEL (BLADE) ×2 IMPLANT
CANISTER SUCT 1200ML W/VALVE (MISCELLANEOUS) ×2 IMPLANT
CATH CHOLANGI 4FR 420404F (CATHETERS) IMPLANT
CHLORAPREP W/TINT 26ML (MISCELLANEOUS) ×2 IMPLANT
CLIP APPLIE ROT 10 11.4 M/L (STAPLE) ×1 IMPLANT
CONRAY 60ML FOR OR (MISCELLANEOUS) IMPLANT
DRAPE C-ARM XRAY 36X54 (DRAPES) IMPLANT
ELECT REM PT RETURN 9FT ADLT (ELECTROSURGICAL) ×2
ELECTRODE REM PT RTRN 9FT ADLT (ELECTROSURGICAL) ×1 IMPLANT
ENDOPOUCH RETRIEVER 10 (MISCELLANEOUS) ×2 IMPLANT
GAUZE SPONGE NON-WVN 2X2 STRL (MISCELLANEOUS) ×4 IMPLANT
GLOVE BIO SURGEON STRL SZ8 (GLOVE) ×2 IMPLANT
GOWN STRL REUS W/ TWL LRG LVL3 (GOWN DISPOSABLE) ×4 IMPLANT
GOWN STRL REUS W/TWL LRG LVL3 (GOWN DISPOSABLE) ×4
IRRIGATION STRYKERFLOW (MISCELLANEOUS) IMPLANT
IRRIGATOR STRYKERFLOW (MISCELLANEOUS)
IV CATH ANGIO 12GX3 LT BLUE (NEEDLE) ×2 IMPLANT
IV NS 1000ML (IV SOLUTION)
IV NS 1000ML BAXH (IV SOLUTION) IMPLANT
JACKSON PRATT 10 (INSTRUMENTS) ×2 IMPLANT
KIT RM TURNOVER STRD PROC AR (KITS) ×2 IMPLANT
LABEL OR SOLS (LABEL) ×2 IMPLANT
NDL SAFETY 22GX1.5 (NEEDLE) ×2 IMPLANT
NEEDLE VERESS 14GA 120MM (NEEDLE) ×2 IMPLANT
NS IRRIG 500ML POUR BTL (IV SOLUTION) ×2 IMPLANT
PACK LAP CHOLECYSTECTOMY (MISCELLANEOUS) ×2 IMPLANT
SCISSORS METZENBAUM CVD 33 (INSTRUMENTS) ×2 IMPLANT
SET SUCTION IRRIG HYDROSURG (IRRIGATION / IRRIGATOR) ×4 IMPLANT
SLEEVE ENDOPATH XCEL 5M (ENDOMECHANICALS) ×4 IMPLANT
SPONGE LAP 18X18 5 PK (GAUZE/BANDAGES/DRESSINGS) ×2 IMPLANT
SPONGE VERSALON 2X2 STRL (MISCELLANEOUS) ×4
SPONGE VERSALON 4X4 4PLY (MISCELLANEOUS) IMPLANT
STRIP CLOSURE SKIN 1/2X4 (GAUZE/BANDAGES/DRESSINGS) ×2 IMPLANT
SUT MNCRL 4-0 (SUTURE) ×1
SUT MNCRL 4-0 27XMFL (SUTURE) ×1
SUT VICRYL 0 AB UR-6 (SUTURE) ×2 IMPLANT
SUTURE MNCRL 4-0 27XMF (SUTURE) ×1 IMPLANT
SYR 20CC LL (SYRINGE) ×2 IMPLANT
TROCAR XCEL NON-BLD 11X100MML (ENDOMECHANICALS) ×2 IMPLANT
TROCAR XCEL NON-BLD 5MMX100MML (ENDOMECHANICALS) ×6 IMPLANT
TUBING INSUFFLATOR HI FLOW (MISCELLANEOUS) ×2 IMPLANT

## 2016-06-17 NOTE — Discharge Instructions (Signed)
Remove dressing in 24 hours. °May shower in 24 hours. °Leave paper strips in place. °Resume all home medications. °Follow-up with Dr. Joeph Szatkowski in 10 days. °

## 2016-06-17 NOTE — Anesthesia Preprocedure Evaluation (Signed)
Anesthesia Evaluation  Patient identified by MRN, date of birth, ID band Patient awake    Reviewed: Allergy & Precautions, H&P , NPO status , Patient's Chart, lab work & pertinent test results, reviewed documented beta blocker date and time   History of Anesthesia Complications (+) history of anesthetic complications  Airway Mallampati: III  TM Distance: >3 FB Neck ROM: full    Dental  (+) Teeth Intact   Pulmonary neg pulmonary ROS,    Pulmonary exam normal        Cardiovascular hypertension, negative cardio ROS Normal cardiovascular exam Rhythm:regular Rate:Normal     Neuro/Psych  Headaches, negative neurological ROS  negative psych ROS   GI/Hepatic negative GI ROS, Neg liver ROS, GERD  Medicated,  Endo/Other  negative endocrine ROS  Renal/GU negative Renal ROS  negative genitourinary   Musculoskeletal   Abdominal   Peds  Hematology negative hematology ROS (+)   Anesthesia Other Findings Past Medical History: No date: Allergy No date: Anxiety No date: Complication of anesthesia     Comment: pt panics easily feeling like she cant breathe No date: GERD (gastroesophageal reflux disease) No date: Headache     Comment: occ-migraines No date: Hypertension     Comment: PCP prescribed pt bp med but pt never took it Past Surgical History: 1980: APPENDECTOMY     Comment: Open with Bowel Resection due to Perforation 1980: COLON SURGERY     Comment: Bowel Resection during Appendectomy secondary               to Perforation BMI    Body Mass Index:  41.88 kg/m     Reproductive/Obstetrics negative OB ROS                             Anesthesia Physical Anesthesia Plan  ASA: III  Anesthesia Plan: General ETT   Post-op Pain Management:    Induction:   Airway Management Planned: Video Laryngoscope Planned  Additional Equipment:   Intra-op Plan:   Post-operative Plan:    Informed Consent: I have reviewed the patients History and Physical, chart, labs and discussed the procedure including the risks, benefits and alternatives for the proposed anesthesia with the patient or authorized representative who has indicated his/her understanding and acceptance.   Dental Advisory Given  Plan Discussed with: CRNA  Anesthesia Plan Comments:         Anesthesia Quick Evaluation

## 2016-06-17 NOTE — Anesthesia Procedure Notes (Signed)
Procedure Name: Intubation Date/Time: 06/17/2016 12:14 PM Performed by: Silvana Newness Pre-anesthesia Checklist: Patient identified, Emergency Drugs available, Suction available, Patient being monitored and Timeout performed Patient Re-evaluated:Patient Re-evaluated prior to inductionOxygen Delivery Method: Circle system utilized Preoxygenation: Pre-oxygenation with 100% oxygen Intubation Type: IV induction Ventilation: Mask ventilation without difficulty Laryngoscope Size: Glidescope and 3 Grade View: Grade I Tube type: Oral Tube size: 7.0 mm Number of attempts: 1 Airway Equipment and Method: Stylet and Patient positioned with wedge pillow Placement Confirmation: ETT inserted through vocal cords under direct vision,  positive ETCO2 and breath sounds checked- equal and bilateral Secured at: 20 cm Tube secured with: Tape Dental Injury: Teeth and Oropharynx as per pre-operative assessment

## 2016-06-17 NOTE — Progress Notes (Signed)
Preoperative Review   Patient is met in the preoperative holding area. The history is reviewed in the chart and with the patient. I personally reviewed the options and rationale as well as the risks of this procedure that have been previously discussed with the patient. All questions asked by the patient and/or family were answered to their satisfaction.  Patient agrees to proceed with this procedure at this time.  Ilir Mahrt E Shary Lamos M.D. FACS  

## 2016-06-17 NOTE — Transfer of Care (Signed)
Immediate Anesthesia Transfer of Care Note  Patient: Hailey Sheppard  Procedure(s) Performed: Procedure(s): LAPAROSCOPIC CHOLECYSTECTOMY (N/A)  Patient Location: PACU  Anesthesia Type:General  Level of Consciousness: patient cooperative and lethargic  Airway & Oxygen Therapy: Patient Spontanous Breathing and Patient connected to face mask oxygen  Post-op Assessment: Report given to RN and Post -op Vital signs reviewed and stable  Post vital signs: Reviewed and stable  Last Vitals:  Vitals:   06/17/16 1000 06/17/16 1344  BP: (!) 144/91 (!) 156/91  Pulse: 98 84  Resp: 16 18  Temp: 37.2 C     Last Pain:  Vitals:   06/17/16 1000  TempSrc: Oral         Complications: No apparent anesthesia complications

## 2016-06-17 NOTE — Op Note (Signed)
Laparoscopic Cholecystectomy  Pre-operative Diagnosis: Biliary colic  Post-operative Diagnosis: Chronic cholecystitis  Procedure: Laparoscopic cholecystectomy  Surgeon: Jerrol Banana. Burt Knack, MD FACS  Anesthesia: Gen. with endotracheal tube  Assistant: PA student  Procedure Details  The patient was seen again in the Holding Room. The benefits, complications, treatment options, and expected outcomes were discussed with the patient. The risks of bleeding, infection, recurrence of symptoms, failure to resolve symptoms, bile duct damage, bile duct leak, retained common bile duct stone, bowel injury, any of which could require further surgery and/or ERCP, stent, or papillotomy were reviewed with the patient. The likelihood of improving the patient's symptoms with return to their baseline status is good.  The patient and/or family concurred with the proposed plan, giving informed consent.  The patient was taken to Operating Room, identified as Hailey Sheppard and the procedure verified as Laparoscopic Cholecystectomy.  A Time Out was held and the above information confirmed.  Prior to the induction of general anesthesia, antibiotic prophylaxis was administered. VTE prophylaxis was in place. General endotracheal anesthesia was then administered and tolerated well. After the induction, the abdomen was prepped with Chloraprep and draped in the sterile fashion. The patient was positioned in the supine position.  Local anesthetic  was injected into the skin near the umbilicus and an incision made. The Veress needle was placed. Pneumoperitoneum was then created with CO2 and tolerated well without any adverse changes in the patient's vital signs. A 82mm port was placed in the periumbilical position and the abdominal cavity was explored.  Two 5-mm ports were placed in the right upper quadrant and a 12 mm epigastric port was placed all under direct vision. All skin incisions  were infiltrated with a local anesthetic  agent before making the incision and placing the trocars.   Adhesions in the right upper quadrant did not involve bowel but were taken down sharply without the use of energy to enhance dissection of the right upper quadrant. No bowel was identified in these adhesions and in subsequent inspections of this area there was no sign of bowel injury or bleeding.  The patient was positioned  in reverse Trendelenburg, tilted slightly to the patient's left.  The gallbladder was identified, the fundus grasped and retracted cephalad. Adhesions were lysed bluntly. There was intense fibrotic response in the area of the infundibulum and the fundus. These adhesions were taken down first sharply and then bluntly. The infundibulum was grasped and retracted laterally, exposing the peritoneum overlying the triangle of Calot. This was then divided and exposed in a blunt fashion. Multiple small venous branches were clipped and incised. Some of these were lateral to the gallbladder at the edge of the liver edge. A critical view of the cystic duct and cystic artery was obtained.  The cystic duct was clearly identified and bluntly dissected.   The cystic artery was doubly clipped and divided. The cystic duct was then well identified as it entered the infundibulum and here it was doubly clipped and divided.  The gallbladder was taken from the gallbladder fossa in a retrograde fashion with the electrocautery. Extensive dense fibrotic tissue was encountered especially in the gallbladder fossa. A single large stone was encountered and ultimately retrieved. The gallbladder was removed and placed in an Endocatch bag. The liver bed was irrigated and inspected. Hemostasis was achieved with the electrocautery. Copious irrigation was utilized and was repeatedly aspirated until clear.  The gallbladder and Endocatch sac were then removed through the epigastric port site. The epigastric port site  required enlargement due to the large size of the  single stone. More Marcaine was placed in this area.  A 10 mm JP drain was brought in through a lateral port site and placed into the foramen of Winslow and held in with 3-0 nylon.  Inspection of the right upper quadrant was performed. No bleeding, bile duct injury or leak, or bowel injury was noted. Pneumoperitoneum was released.  The epigastric port site was closed with figure-of-eight 0 Vicryl sutures. 4-0 subcuticular Monocryl was used to close the skin. Steristrips and Mastisol and sterile dressings were  applied.  The patient was then extubated and brought to the recovery room in stable condition. Sponge, lap, and needle counts were correct at closure and at the conclusion of the case.   Findings: Severe chronic Cholecystitis with fibrosis  Estimated Blood Loss: 50 cc         Drains: JP 1         Specimens: Gallbladder           Complications: none               Richard E. Burt Knack, MD, FACS

## 2016-06-17 NOTE — Anesthesia Post-op Follow-up Note (Cosign Needed)
Anesthesia QCDR form completed.        

## 2016-06-18 DIAGNOSIS — K8064 Calculus of gallbladder and bile duct with chronic cholecystitis without obstruction: Secondary | ICD-10-CM | POA: Diagnosis not present

## 2016-06-18 LAB — COMPREHENSIVE METABOLIC PANEL
ALBUMIN: 3 g/dL — AB (ref 3.5–5.0)
ALT: 28 U/L (ref 14–54)
ANION GAP: 7 (ref 5–15)
AST: 38 U/L (ref 15–41)
Alkaline Phosphatase: 53 U/L (ref 38–126)
BUN: 8 mg/dL (ref 6–20)
CHLORIDE: 108 mmol/L (ref 101–111)
CO2: 21 mmol/L — AB (ref 22–32)
Calcium: 8.5 mg/dL — ABNORMAL LOW (ref 8.9–10.3)
Creatinine, Ser: 0.63 mg/dL (ref 0.44–1.00)
GFR calc Af Amer: >60 mL/min (ref >60)
GFR calc non Af Amer: >60 mL/min (ref >60)
GLUCOSE: 129 mg/dL — AB (ref 65–99)
POTASSIUM: 4 mmol/L (ref 3.5–5.1)
SODIUM: 136 mmol/L (ref 135–145)
Total Bilirubin: 0.4 mg/dL (ref 0.3–1.2)
Total Protein: 6.6 g/dL (ref 6.5–8.1)

## 2016-06-18 LAB — CBC
HEMATOCRIT: 32.9 % — AB (ref 35.0–47.0)
HEMOGLOBIN: 11.5 g/dL — AB (ref 12.0–16.0)
MCH: 29.7 pg (ref 26.0–34.0)
MCHC: 34.9 g/dL (ref 32.0–36.0)
MCV: 85.1 fL (ref 80.0–100.0)
Platelets: 244 10*3/uL (ref 150–440)
RBC: 3.86 MIL/uL (ref 3.80–5.20)
RDW: 14.1 % (ref 11.5–14.5)
WBC: 10.4 10*3/uL (ref 3.6–11.0)

## 2016-06-18 LAB — SURGICAL PATHOLOGY

## 2016-06-18 NOTE — Discharge Summary (Signed)
Patient ID: Hailey Sheppard MRN: [id number] DOB/AGE: 03-Apr-1966 51 y.o.  Admit date: 06/17/2016 Discharge date: 06/18/2016   Discharge Diagnoses:  Active Problems:   Calculus of gallbladder with chronic cholecystitis without obstruction   Biliary colic   Procedures: laparoscopic cholecystectomy  Hospital Course:  Patient was taken to the operating room on 1/17 for a laparoscopic cholecystectomy.  She tolerated the procedure well and there were no complications.  JP in place.  Her diet was advanced and she was tolerating a regular diet on POD#1.  Her pain was well controlled.  She was ambulating.  Her JP had serosanguinous fluid and was removed prior to discharge.  She was discharged in good condition.  Her physical exam revealed an abdomen that was soft, nondistended, and appropriately tender to palpation.  Her incisions were clean and dry with dressings in place.  She was in no distress.  Consults: None  Disposition: 01-Home or Self Care  Discharge Instructions    Call MD for:  difficulty breathing, headache or visual disturbances    Complete by:  As directed    Call MD for:  persistant nausea and vomiting    Complete by:  As directed    Call MD for:  redness, tenderness, or signs of infection (pain, swelling, redness, odor or green/yellow discharge around incision site)    Complete by:  As directed    Call MD for:  severe uncontrolled pain    Complete by:  As directed    Call MD for:  temperature >100.4    Complete by:  As directed    Diet - low sodium heart healthy    Complete by:  As directed    Discharge instructions    Complete by:  As directed    Patient may shower but do not scrub wounds heavily and dab dry only.  Allow steri strips to fall off on their own.   Driving Restrictions    Complete by:  As directed    Do not drive while taking narcotics for pain control.   Increase activity slowly    Complete by:  As directed    Lifting restrictions    Complete by:  As  directed    No heavy lifting of more than 10-15 lbs for 4 weeks.   Remove dressing in 24 hours    Complete by:  As directed      Allergies as of 06/18/2016      Reactions   Erythromycin    nausea/vomiting      Medication List    TAKE these medications   acetaminophen 500 MG tablet Commonly known as:  TYLENOL Take 500 mg by mouth every 6 (six) hours as needed for mild pain.   DAYTIME SINUS CONGESTION PO Take 1 tablet by mouth 3 (three) times daily as needed (sinus congestion).   dicyclomine 10 MG capsule Commonly known as:  BENTYL Take 1 capsule (10 mg total) by mouth 3 (three) times daily as needed for spasms.   esomeprazole 20 MG capsule Commonly known as:  NEXIUM Take 20 mg by mouth at bedtime.   HYDROcodone-acetaminophen 5-325 MG tablet Commonly known as:  NORCO/VICODIN Take 1 tablet by mouth every 4 (four) hours as needed for moderate pain.   ibuprofen 200 MG tablet Commonly known as:  ADVIL,MOTRIN Take 200 mg by mouth every 6 (six) hours as needed for mild pain.   loratadine 10 MG tablet Commonly known as:  CLARITIN Take 10 mg by mouth daily as needed for  allergies.   montelukast 10 MG tablet Commonly known as:  SINGULAIR Take 10 mg by mouth daily as needed (allergies).   NASACORT AQ NA Place 1 spray into the nose daily.   PREPARATION H 0.25-88.44 % suppository Generic drug:  shark liver oil-cocoa butter Place 1 suppository rectally as needed for hemorrhoids.   PREPARATION H 1-0.25-14.4-15 % Crea Generic drug:  Pramox-PE-Glycerin-Petrolatum Place rectally as needed.   promethazine 12.5 MG tablet Commonly known as:  PHENERGAN Take 12.5 mg by mouth every 6 (six) hours as needed for nausea or vomiting.      Follow-up Information    Phoebe Perch, MD Follow up in 2 week(s).   Specialty:  Surgery Contact information: [address] [place] [address] [phone]

## 2016-06-18 NOTE — Progress Notes (Signed)
06/18/2016 3:09 PM  Hailey Sheppard to be D/C'd Home per MD order.  Discussed prescriptions and follow up appointments with the patient. Prescriptions given to patient, medication list explained in detail. Pt verbalized understanding.  Allergies as of 06/18/2016      Reactions   Erythromycin    nausea/vomiting      Medication List    TAKE these medications   acetaminophen 500 MG tablet Commonly known as:  TYLENOL Take 500 mg by mouth every 6 (six) hours as needed for mild pain.   DAYTIME SINUS CONGESTION PO Take 1 tablet by mouth 3 (three) times daily as needed (sinus congestion).   dicyclomine 10 MG capsule Commonly known as:  BENTYL Take 1 capsule (10 mg total) by mouth 3 (three) times daily as needed for spasms.   esomeprazole 20 MG capsule Commonly known as:  NEXIUM Take 20 mg by mouth at bedtime.   HYDROcodone-acetaminophen 5-325 MG tablet Commonly known as:  NORCO/VICODIN Take 1 tablet by mouth every 4 (four) hours as needed for moderate pain.   ibuprofen 200 MG tablet Commonly known as:  ADVIL,MOTRIN Take 200 mg by mouth every 6 (six) hours as needed for mild pain.   loratadine 10 MG tablet Commonly known as:  CLARITIN Take 10 mg by mouth daily as needed for allergies.   montelukast 10 MG tablet Commonly known as:  SINGULAIR Take 10 mg by mouth daily as needed (allergies).   NASACORT AQ NA Place 1 spray into the nose daily.   PREPARATION H 0.25-88.44 % suppository Generic drug:  shark liver oil-cocoa butter Place 1 suppository rectally as needed for hemorrhoids.   PREPARATION H 1-0.25-14.4-15 % Crea Generic drug:  Pramox-PE-Glycerin-Petrolatum Place rectally as needed.   promethazine 12.5 MG tablet Commonly known as:  PHENERGAN Take 12.5 mg by mouth every 6 (six) hours as needed for nausea or vomiting.       Vitals:   06/18/16 0408 06/18/16 0754  BP: 128/69 129/76  Pulse: 69 72  Resp:  17  Temp: 98.1 F (36.7 C) 97.9 F (36.6 C)    Skin  clean, dry and intact without evidence of skin break down, no evidence of skin tears noted. IV catheter discontinued intact. Site without signs and symptoms of complications. Dressing and pressure applied. Pt denies pain at this time. No complaints noted.  An After Visit Summary was printed and given to the patient. Patient escorted via [place], and D/C home via private auto.  Dola Argyle

## 2016-06-19 NOTE — Anesthesia Postprocedure Evaluation (Signed)
Anesthesia Post Note  Patient: Hailey Sheppard  Procedure(s) Performed: Procedure(s) (LRB): LAPAROSCOPIC CHOLECYSTECTOMY (N/A)  Patient location during evaluation: PACU Anesthesia Type: General Level of consciousness: awake and alert Pain management: pain level controlled Vital Signs Assessment: post-procedure vital signs reviewed and stable Respiratory status: spontaneous breathing, nonlabored ventilation, respiratory function stable and patient connected to nasal cannula oxygen Cardiovascular status: blood pressure returned to baseline and stable Postop Assessment: no signs of nausea or vomiting Anesthetic complications: no     Last Vitals:  Vitals:   06/18/16 0408 06/18/16 0754  BP: 128/69 129/76  Pulse: 69 72  Resp:  17  Temp: 36.7 C 36.6 C    Last Pain:  Vitals:   06/18/16 1408  TempSrc:   PainSc: 1                  Molli Barrows

## 2016-06-25 ENCOUNTER — Telehealth: Payer: Self-pay

## 2016-06-25 NOTE — Telephone Encounter (Signed)
Patient called stating she had some constipation and wanted to know what to do. I recommended for her to start taking Miralax 17 G twice a day to help her move her bowel movements. Patient also stated that she was suffering from hemorrhoids due to her constipation. I also recommended for her to try doing sitz baths twice a day and to apply preparation-H on her hemorrhoids. Patient stated that she would try doing what was recommended. I told her that she could call me back if she is no better. Patient understood and had no further questions.

## 2016-07-02 ENCOUNTER — Encounter: Payer: Self-pay | Admitting: General Surgery

## 2016-07-02 ENCOUNTER — Ambulatory Visit (INDEPENDENT_AMBULATORY_CARE_PROVIDER_SITE_OTHER): Payer: Commercial Managed Care - HMO | Admitting: General Surgery

## 2016-07-02 VITALS — BP 123/87 | HR 76 | Temp 98.3°F | Ht 62.0 in | Wt 222.0 lb

## 2016-07-02 DIAGNOSIS — Z4889 Encounter for other specified surgical aftercare: Secondary | ICD-10-CM

## 2016-07-02 NOTE — Progress Notes (Signed)
Outpatient Surgical Follow Up  07/02/2016  Hailey Sheppard is an 51 y.o. female.   Chief Complaint  Patient presents with  . Routine Post Op    Laparoscopic Cholecystectomy Dr. Burt Knack 06/17/2016    HPI: 51 year old female returns to clinic 2 weeks status post laparoscopic cholecystectomy. She denies any abdominal pain and states she is eating well and having normal bowel function. She's been very happy with her surgical experience.  Past Medical History:  Diagnosis Date  . Allergy   . Anxiety   . Complication of anesthesia    pt panics easily feeling like she cant breathe  . GERD (gastroesophageal reflux disease)   . Headache    occ-migraines  . Hypertension    PCP prescribed pt bp med but pt never took it    Past Surgical History:  Procedure Laterality Date  . APPENDECTOMY  1980   Open with Bowel Resection due to Perforation  . CHOLECYSTECTOMY N/A 06/17/2016   Procedure: LAPAROSCOPIC CHOLECYSTECTOMY;  Surgeon: Florene Glen, MD;  Location: [facility] ORS;  Service: General;  Laterality: N/A;  . COLON SURGERY  1980   Bowel Resection during Appendectomy secondary to Perforation    Family History  Problem Relation Age of Onset  . Diabetes Mother   . Heart disease Mother     Arrythmia (Unknown Type)  . Diabetes Father   . Gallbladder disease Brother   . Gallbladder disease Maternal Grandmother     Social History:  reports that she has never smoked. She has never used smokeless tobacco. She reports that she drinks alcohol. She reports that she does not use drugs.  Allergies:  Allergies  Allergen Reactions  . Erythromycin     nausea/vomiting    Medications reviewed.    ROS A multipoint review of systems was completed, all pertinent positives and negatives are documented within the history of present illness and remainder are negative   BP 123/87   Pulse 76   Temp 98.3 F (36.8 C) (Oral)   Ht 5\' 2"  (1.575 m)   Wt 100.7 kg (222 lb)   BMI 40.60 kg/m   Physical  Exam Gen.: No acute distress Chest: Clear to auscultation Heart: Regular rhythm Abdomen: Soft, nontender, nondistended. Well approximated laparoscopic incision sites without evidence of erythema or drainage. Scabs present on the 5 mm incisions.    No results found for this or any previous visit (from the past 48 hour(s)). No results found.  Assessment/Plan:  1. Aftercare following surgery 51 year old female status post laparoscopic cholecystectomy. Pathology provided and reviewed. Discussed anticipated continued recovery and returning to normal function and activities. All questions answered to the patient's satisfaction and she will follow up on an as-needed basis.     Clayburn Pert, MD FACS General Surgeon  07/02/2016,9:40 AM

## 2016-07-02 NOTE — Patient Instructions (Signed)
Please call our office with any questions or concerns.  Please do not submerge in a tub, hot tub, or pool until incisions are completely sealed.  Use sun block to incision area over the next year if this area will be exposed to sun. This helps decrease scarring.  Listen to your body when lifting, if you have pain when lifting, stop and then try again in a few days. Pain after doing exercises or activities of daily living is normal as you get back in to your normal routine.  If you develop redness, drainage, or pain at incision sites- call our office immediately and speak with a nurse.  

## 2016-12-16 ENCOUNTER — Other Ambulatory Visit: Payer: Self-pay | Admitting: Family Medicine

## 2016-12-16 ENCOUNTER — Ambulatory Visit (INDEPENDENT_AMBULATORY_CARE_PROVIDER_SITE_OTHER): Payer: 59 | Admitting: Family Medicine

## 2016-12-16 ENCOUNTER — Encounter: Payer: Self-pay | Admitting: Family Medicine

## 2016-12-16 ENCOUNTER — Telehealth: Payer: Self-pay

## 2016-12-16 VITALS — BP 126/82 | HR 82 | Temp 98.3°F | Resp 14 | Ht 61.5 in | Wt 218.9 lb

## 2016-12-16 DIAGNOSIS — R102 Pelvic and perineal pain: Secondary | ICD-10-CM

## 2016-12-16 DIAGNOSIS — Z124 Encounter for screening for malignant neoplasm of cervix: Secondary | ICD-10-CM

## 2016-12-16 DIAGNOSIS — Z1231 Encounter for screening mammogram for malignant neoplasm of breast: Secondary | ICD-10-CM | POA: Diagnosis not present

## 2016-12-16 DIAGNOSIS — Z1211 Encounter for screening for malignant neoplasm of colon: Secondary | ICD-10-CM

## 2016-12-16 DIAGNOSIS — Z1239 Encounter for other screening for malignant neoplasm of breast: Secondary | ICD-10-CM

## 2016-12-16 DIAGNOSIS — Z0000 Encounter for general adult medical examination without abnormal findings: Secondary | ICD-10-CM

## 2016-12-16 LAB — CBC WITH DIFFERENTIAL/PLATELET
BASOS ABS: 0 {cells}/uL (ref 0–200)
BASOS PCT: 0 %
EOS PCT: 1 %
Eosinophils Absolute: 84 cells/uL (ref 15–500)
HCT: 39.8 % (ref 35.0–45.0)
Hemoglobin: 12.9 g/dL (ref 11.7–15.5)
LYMPHS PCT: 22 %
Lymphs Abs: 1848 cells/uL (ref 850–3900)
MCH: 27.9 pg (ref 27.0–33.0)
MCHC: 32.4 g/dL (ref 32.0–36.0)
MCV: 86.1 fL (ref 80.0–100.0)
MONOS PCT: 7 %
MPV: 10.5 fL (ref 7.5–12.5)
Monocytes Absolute: 588 cells/uL (ref 200–950)
NEUTROS ABS: 5880 {cells}/uL (ref 1500–7800)
Neutrophils Relative %: 70 %
PLATELETS: 258 10*3/uL (ref 140–400)
RBC: 4.62 MIL/uL (ref 3.80–5.10)
RDW: 15 % (ref 11.0–15.0)
WBC: 8.4 10*3/uL (ref 3.8–10.8)

## 2016-12-16 LAB — TSH: TSH: 0.81 m[IU]/L

## 2016-12-16 MED ORDER — VENLAFAXINE HCL ER 37.5 MG PO CP24: 37.5000 mg | ORAL_CAPSULE | Freq: Every day | ORAL | 0 refills | Status: DC

## 2016-12-16 NOTE — Patient Instructions (Addendum)
Let's get labs today Return for any other issues Health Maintenance, Female Adopting a healthy lifestyle and getting preventive care can go a long way to promote health and wellness. Talk with your health care provider about what schedule of regular examinations is right for you. This is a good chance for you to check in with your provider about disease prevention and staying healthy. In between checkups, there are plenty of things you can do on your own. Experts have done a lot of research about which lifestyle changes and preventive measures are most likely to keep you healthy. Ask your health care provider for more information. Weight and diet Eat a healthy diet  Be sure to include plenty of vegetables, fruits, low-fat dairy products, and lean protein.  Do not eat a lot of foods high in solid fats, added sugars, or salt.  Get regular exercise. This is one of the most important things you can do for your health. ? Most adults should exercise for at least 150 minutes each week. The exercise should increase your heart rate and make you sweat (moderate-intensity exercise). ? Most adults should also do strengthening exercises at least twice a week. This is in addition to the moderate-intensity exercise.  Maintain a healthy weight  Body mass index (BMI) is a measurement that can be used to identify possible weight problems. It estimates body fat based on height and weight. Your health care provider can help determine your BMI and help you achieve or maintain a healthy weight.  For females 61 years of age and older: ? A BMI below 18.5 is considered underweight. ? A BMI of 18.5 to 24.9 is normal. ? A BMI of 25 to 29.9 is considered overweight. ? A BMI of 30 and above is considered obese.  Watch levels of cholesterol and blood lipids  You should start having your blood tested for lipids and cholesterol at 52 years of age, then have this test every 5 years.  You may need to have your  cholesterol levels checked more often if: ? Your lipid or cholesterol levels are high. ? You are older than 52 years of age. ? You are at high risk for heart disease.  Cancer screening Lung Cancer  Lung cancer screening is recommended for adults 76-46 years old who are at high risk for lung cancer because of a history of smoking.  A yearly low-dose CT scan of the lungs is recommended for people who: ? Currently smoke. ? Have quit within the past 15 years. ? Have at least a 30-pack-year history of smoking. A pack year is smoking an average of one pack of cigarettes a day for 1 year.  Yearly screening should continue until it has been 15 years since you quit.  Yearly screening should stop if you develop a health problem that would prevent you from having lung cancer treatment.  Breast Cancer  Practice breast self-awareness. This means understanding how your breasts normally appear and feel.  It also means doing regular breast self-exams. Let your health care provider know about any changes, no matter how small.  If you are in your 20s or 30s, you should have a clinical breast exam (CBE) by a health care provider every 1-3 years as part of a regular health exam.  If you are 60 or older, have a CBE every year. Also consider having a breast X-ray (mammogram) every year.  If you have a family history of breast cancer, talk to your health care provider about  genetic screening.  If you are at high risk for breast cancer, talk to your health care provider about having an MRI and a mammogram every year.  Breast cancer gene (BRCA) assessment is recommended for women who have family members with BRCA-related cancers. BRCA-related cancers include: ? Breast. ? Ovarian. ? Tubal. ? Peritoneal cancers.  Results of the assessment will determine the need for genetic counseling and BRCA1 and BRCA2 testing.  Cervical Cancer Your health care provider may recommend that you be screened regularly  for cancer of the pelvic organs (ovaries, uterus, and vagina). This screening involves a pelvic examination, including checking for microscopic changes to the surface of your cervix (Pap test). You may be encouraged to have this screening done every 3 years, beginning at age 54.  For women ages 20-65, health care providers may recommend pelvic exams and Pap testing every 3 years, or they may recommend the Pap and pelvic exam, combined with testing for human papilloma virus (HPV), every 5 years. Some types of HPV increase your risk of cervical cancer. Testing for HPV may also be done on women of any age with unclear Pap test results.  Other health care providers may not recommend any screening for nonpregnant women who are considered low risk for pelvic cancer and who do not have symptoms. Ask your health care provider if a screening pelvic exam is right for you.  If you have had past treatment for cervical cancer or a condition that could lead to cancer, you need Pap tests and screening for cancer for at least 20 years after your treatment. If Pap tests have been discontinued, your risk factors (such as having a new sexual partner) need to be reassessed to determine if screening should resume. Some women have medical problems that increase the chance of getting cervical cancer. In these cases, your health care provider may recommend more frequent screening and Pap tests.  Colorectal Cancer  This type of cancer can be detected and often prevented.  Routine colorectal cancer screening usually begins at 52 years of age and continues through 52 years of age.  Your health care provider may recommend screening at an earlier age if you have risk factors for colon cancer.  Your health care provider may also recommend using home test kits to check for hidden blood in the stool.  A small camera at the end of a tube can be used to examine your colon directly (sigmoidoscopy or colonoscopy). This is done to  check for the earliest forms of colorectal cancer.  Routine screening usually begins at age 74.  Direct examination of the colon should be repeated every 5-10 years through 52 years of age. However, you may need to be screened more often if early forms of precancerous polyps or small growths are found.  Skin Cancer  Check your skin from head to toe regularly.  Tell your health care provider about any new moles or changes in moles, especially if there is a change in a mole's shape or color.  Also tell your health care provider if you have a mole that is larger than the size of a pencil eraser.  Always use sunscreen. Apply sunscreen liberally and repeatedly throughout the day.  Protect yourself by wearing long sleeves, pants, a wide-brimmed hat, and sunglasses whenever you are outside.  Heart disease, diabetes, and high blood pressure  High blood pressure causes heart disease and increases the risk of stroke. High blood pressure is more likely to develop in: ? People  People who have blood pressure in the high end of the normal range (130-139/85-89 mm Hg). ? People who are overweight or obese. ? People who are African American.  If you are 18-39 years of age, have your blood pressure checked every 3-5 years. If you are 40 years of age or older, have your blood pressure checked every year. You should have your blood pressure measured twice-once when you are at a hospital or clinic, and once when you are not at a hospital or clinic. Record the average of the two measurements. To check your blood pressure when you are not at a hospital or clinic, you can use: ? An automated blood pressure machine at a pharmacy. ? A home blood pressure monitor.  If you are between 55 years and 79 years old, ask your health care provider if you should take aspirin to prevent strokes.  Have regular diabetes screenings. This involves taking a blood sample to check your fasting blood sugar level. ? If you are at a  normal weight and have a low risk for diabetes, have this test once every three years after 52 years of age. ? If you are overweight and have a high risk for diabetes, consider being tested at a younger age or more often. Preventing infection Hepatitis B  If you have a higher risk for hepatitis B, you should be screened for this virus. You are considered at high risk for hepatitis B if: ? You were born in a country where hepatitis B is common. Ask your health care provider which countries are considered high risk. ? Your parents were born in a high-risk country, and you have not been immunized against hepatitis B (hepatitis B vaccine). ? You have HIV or AIDS. ? You use needles to inject street drugs. ? You live with someone who has hepatitis B. ? You have had sex with someone who has hepatitis B. ? You get hemodialysis treatment. ? You take certain medicines for conditions, including cancer, organ transplantation, and autoimmune conditions.  Hepatitis C  Blood testing is recommended for: ? Everyone born from 1945 through 1965. ? Anyone with known risk factors for hepatitis C.  Sexually transmitted infections (STIs)  You should be screened for sexually transmitted infections (STIs) including gonorrhea and chlamydia if: ? You are sexually active and are younger than 52 years of age. ? You are older than 52 years of age and your health care provider tells you that you are at risk for this type of infection. ? Your sexual activity has changed since you were last screened and you are at an increased risk for chlamydia or gonorrhea. Ask your health care provider if you are at risk.  If you do not have HIV, but are at risk, it may be recommended that you take a prescription medicine daily to prevent HIV infection. This is called pre-exposure prophylaxis (PrEP). You are considered at risk if: ? You are sexually active and do not regularly use condoms or know the HIV status of your  partner(s). ? You take drugs by injection. ? You are sexually active with a partner who has HIV.  Talk with your health care provider about whether you are at high risk of being infected with HIV. If you choose to begin PrEP, you should first be tested for HIV. You should then be tested every 3 months for as long as you are taking PrEP. Pregnancy  If you are premenopausal and you may become pregnant, ask your health   provider about preconception counseling.  If you may become pregnant, take 400 to 800 micrograms (mcg) of folic acid every day.  If you want to prevent pregnancy, talk to your health care provider about birth control (contraception). Osteoporosis and menopause  Osteoporosis is a disease in which the bones lose minerals and strength with aging. This can result in serious bone fractures. Your risk for osteoporosis can be identified using a bone density scan.  If you are 73 years of age or older, or if you are at risk for osteoporosis and fractures, ask your health care provider if you should be screened.  Ask your health care provider whether you should take a calcium or vitamin D supplement to lower your risk for osteoporosis.  Menopause may have certain physical symptoms and risks.  Hormone replacement therapy may reduce some of these symptoms and risks. Talk to your health care provider about whether hormone replacement therapy is right for you. Follow these instructions at home:  Schedule regular health, dental, and eye exams.  Stay current with your immunizations.  Do not use any tobacco products including cigarettes, chewing tobacco, or electronic cigarettes.  If you are pregnant, do not drink alcohol.  If you are breastfeeding, limit how much and how often you drink alcohol.  Limit alcohol intake to no more than 1 drink per day for nonpregnant women. One drink equals 12 ounces of beer, 5 ounces of wine, or 1 ounces of hard liquor.  Do not use street  drugs.  Do not share needles.  Ask your health care provider for help if you need support or information about quitting drugs.  Tell your health care provider if you often feel depressed.  Tell your health care provider if you have ever been abused or do not feel safe at home. This information is not intended to replace advice given to you by your health care provider. Make sure you discuss any questions you have with your health care provider. Document Released: 12/01/2010 Document Revised: 10/24/2015 Document Reviewed: 02/19/2015 Elsevier Interactive Patient Education  [employer].

## 2016-12-16 NOTE — Progress Notes (Signed)
Patient ID: Hailey Sheppard, female   DOB: 01-Aug-1965, 51 y.o.   MRN: [id number]   Subjective:   Hailey Sheppard is a 51 y.o. female here for a complete physical exam  Interim issues since last visit: choly by Dr. Burt Knack and went well  USPSTF grade A and B recommendations Depression:  Depression screen [facility] 2/9 12/16/2016  Decreased Interest 0  Down, Depressed, Hopeless 0  PHQ - 2 Score 0   Hypertension: controlled BP Readings from Last 3 Encounters:  12/16/16 126/82  07/02/16 123/87  06/18/16 129/76   Obesity: lost 11 pounds since January, scared to eat around her gallbladder problem; making changes; no soft drinks (rare) Wt Readings from Last 3 Encounters:  12/16/16 218 lb 14.4 oz (99.3 kg)  07/02/16 222 lb (100.7 kg)  06/17/16 229 lb (103.9 kg)   BMI Readings from Last 3 Encounters:  12/16/16 40.69 kg/m  07/02/16 40.60 kg/m  06/17/16 41.88 kg/m    Alcohol: drinks less than 7, well under Tobacco use: never HIV, hep B, hep C: not interested STD testing and prevention (chl/gon/syphilis): not interested Intimate partner violence: no abuse Breast cancer: no lumps; nulliparous BRCA gene screening: no ovarian cancer Cervical cancer screening: today, no abnormals Osteoporosis: no steroids as a child; just had LMP Feb 2018 Fall prevention/vitamin D: discussed, no more than 1000 iu  Lipids: fasting today Lab Results  Component Value Date   CHOL 215 (H) 12/16/2016   Lab Results  Component Value Date   HDL 49 (L) 12/16/2016   Lab Results  Component Value Date   LDLCALC 143 (H) 12/16/2016   Lab Results  Component Value Date   TRIG 116 12/16/2016   Lab Results  Component Value Date   CHOLHDL 4.4 12/16/2016   No results found for: LDLDIRECT Glucose: fasting today Glucose, Bld  Date Value Ref Range Status  12/16/2016 100 (H) 65 - 99 mg/dL Final  06/18/2016 129 (H) 65 - 99 mg/dL Final  06/10/2016 114 (H) 65 - 99 mg/dL Final   Colorectal cancer: does not want one;  patient declined; very regular Lung cancer:  Never smoker AAA: n/a Aspirin: not taking now Diet: whatever she wants; bad with sweets and starches Exercise: nothing regular, encouraged working up gradually and build up to 150 minutes a week Skin cancer: nothing worrisome; lots of freckles  Past Medical History:  Diagnosis Date  . Allergy   . Anxiety   . Complication of anesthesia    pt panics easily feeling like she cant breathe  . GERD (gastroesophageal reflux disease)   . Headache    occ-migraines  . Hypertension    PCP prescribed pt bp med but pt never took it   Past Surgical History:  Procedure Laterality Date  . APPENDECTOMY  1980   Open with Bowel Resection due to Perforation  . CHOLECYSTECTOMY N/A 06/17/2016   Procedure: LAPAROSCOPIC CHOLECYSTECTOMY;  Surgeon: Florene Glen, MD;  Location: [facility] ORS;  Service: General;  Laterality: N/A;  . COLON SURGERY  1980   Bowel Resection during Appendectomy secondary to Perforation  . GALLBLADDER SURGERY     januray 17th 2018   Family History  Problem Relation Age of Onset  . Diabetes Mother   . Obesity Mother   . Diabetes Father   . Gallbladder disease Brother   . Ovarian cancer Maternal Grandmother   . Heart disease Maternal Grandfather   . Gallbladder disease Maternal Grandfather   . Stroke Paternal Grandmother   . Kidney cancer  Paternal Grandmother    Social History  Substance Use Topics  . Smoking status: Never Smoker  . Smokeless tobacco: Never Used  . Alcohol use Yes     Comment: Occasional   Review of Systems  Constitutional: Negative for unexpected weight change.  Respiratory: Negative for wheezing.   Cardiovascular: Negative for chest pain.  Genitourinary: Negative for hematuria (none seen, fam hx).    Objective:   Vitals:   12/16/16 1115  BP: 126/82  Pulse: 82  Resp: 14  Temp: 98.3 F (36.8 C)  TempSrc: Oral  SpO2: 94%  Weight: 218 lb 14.4 oz (99.3 kg)  Height: 5' 1.5" (1.562 m)   Body  mass index is 40.69 kg/m. Wt Readings from Last 3 Encounters:  12/16/16 218 lb 14.4 oz (99.3 kg)  07/02/16 222 lb (100.7 kg)  06/17/16 229 lb (103.9 kg)   Physical Exam  Constitutional: She appears well-developed and well-nourished.  HENT:  Head: Normocephalic and atraumatic.  Eyes: Conjunctivae and EOM are normal. Right eye exhibits no hordeolum. Left eye exhibits no hordeolum. No scleral icterus.  Neck: Carotid bruit is not present. No thyromegaly present.  Cardiovascular: Normal rate, regular rhythm, S1 normal, S2 normal and normal heart sounds.   No extrasystoles are present.  Pulmonary/Chest: Effort normal and breath sounds normal. No respiratory distress. Right breast exhibits no inverted nipple, no mass, no nipple discharge, no skin change and no tenderness. Left breast exhibits no inverted nipple, no mass, no nipple discharge, no skin change and no tenderness. Breasts are symmetrical.  Abdominal: Soft. Normal appearance and bowel sounds are normal. She exhibits no distension, no abdominal bruit, no pulsatile midline mass and no mass. There is no hepatosplenomegaly. There is tenderness (lower abdomen/pelvis). No hernia.  Genitourinary: Uterus normal. Pelvic exam was performed with patient prone. There is no rash or lesion on the right labia. There is no rash or lesion on the left labia. Cervix exhibits no motion tenderness. Right adnexum displays tenderness. Right adnexum displays no mass and no fullness. Left adnexum displays tenderness. Left adnexum displays no mass and no fullness.  Musculoskeletal: Normal range of motion. She exhibits no edema.  Lymphadenopathy:       Head (right side): No submandibular adenopathy present.       Head (left side): No submandibular adenopathy present.    She has no cervical adenopathy.    She has no axillary adenopathy.  Neurological: She is alert. She displays no tremor. No cranial nerve deficit. She exhibits normal muscle tone. Gait normal.  Skin:  Skin is warm and dry. No bruising and no ecchymosis noted. No cyanosis. No pallor.  Psychiatric: Her speech is normal and behavior is normal. Thought content normal. Her mood appears not anxious. She does not exhibit a depressed mood.    Assessment/Plan:   Problem List Items Addressed This Visit      Other   Preventative health care - Primary    USPSTF grade A and B recommendations reviewed with patient; age-appropriate recommendations, preventive care, screening tests, etc discussed and encouraged; healthy living encouraged; see AVS for patient education given to patient       Relevant Orders   CBC with Differential/Platelet (Completed)   COMPLETE METABOLIC PANEL WITH GFR (Completed)   Lipid panel (Completed)   TSH (Completed)    Other Visit Diagnoses    Screening for breast cancer       Relevant Orders   MM Digital Screening   Screening for cervical cancer  Relevant Orders   Pap IG and HPV (high risk) DNA detection   Colon cancer screening       Relevant Orders   Cologuard   Pelvic pain       Relevant Orders   US Transvaginal Non-OB   US Pelvis Complete   Urinalysis with Culture Reflex (Completed)       Meds ordered this encounter  Medications  . polyethylene glycol powder (GLYCOLAX/MIRALAX) powder    Sig: Take 1 Container by mouth daily.  . cholecalciferol (VITAMIN D) 1000 units tablet    Sig: Take 1 tablet (1,000 Units total) by mouth daily.  Marland Kitchen venlafaxine XR (EFFEXOR XR) 37.5 MG 24 hr capsule    Sig: Take 1 capsule (37.5 mg total) by mouth daily with breakfast.    Dispense:  30 capsule    Refill:  0   Orders Placed This Encounter  Procedures  . MM Digital Screening    Standing Status:   Future    Standing Expiration Date:   02/16/2018    Order Specific Question:   Reason for Exam (SYMPTOM  OR DIAGNOSIS REQUIRED)    Answer:   screening breast cancer    Order Specific Question:   Is the patient pregnant?    Answer:   No    Order Specific Question:    Preferred imaging location?    Answer:   [facility]  . US Transvaginal Non-OB    Standing Status:   Future    Standing Expiration Date:   02/16/2018    Order Specific Question:   Reason for Exam (SYMPTOM  OR DIAGNOSIS REQUIRED)    Answer:   pelvic pain    Order Specific Question:   Preferred imaging location?    Answer:   [facility]  . US Pelvis Complete    Standing Status:   Future    Standing Expiration Date:   02/16/2018    Order Specific Question:   Reason for Exam (SYMPTOM  OR DIAGNOSIS REQUIRED)    Answer:   pelvic pain    Order Specific Question:   Preferred imaging location?    Answer:   [facility]  . CBC with Differential/Platelet  . COMPLETE METABOLIC PANEL WITH GFR  . Lipid panel  . TSH  . Cologuard  . Urinalysis with Culture Reflex    Follow up plan: Return in about 1 year (around 12/16/2017) for complete physical; 3 week separate visits for other issues.  An After Visit Summary was printed and given to the patient.

## 2016-12-16 NOTE — Assessment & Plan Note (Signed)
USPSTF grade A and B recommendations reviewed with patient; age-appropriate recommendations, preventive care, screening tests, etc discussed and encouraged; healthy living encouraged; see AVS for patient education given to patient  

## 2016-12-16 NOTE — Telephone Encounter (Signed)
Patient has been informed that she is scheduled to have her [place] on this Friday, December 18, 2016 @ 3:30pm at [facility]. Patient was advised to start drinking 32 oz of water about 1hr prior to imaging and to be finish within 30 mins without voiding.  Patient was also instructed to arrive 15 mins early.

## 2016-12-17 LAB — URINALYSIS W MICROSCOPIC + REFLEX CULTURE
BACTERIA UA: NONE SEEN [HPF]
Bilirubin Urine: NEGATIVE
Casts: NONE SEEN [LPF]
Crystals: NONE SEEN [HPF]
Glucose, UA: NEGATIVE
Hgb urine dipstick: NEGATIVE
Ketones, ur: NEGATIVE
LEUKOCYTES UA: NEGATIVE
NITRITE: NEGATIVE
Protein, ur: NEGATIVE
Specific Gravity, Urine: 1.023 (ref 1.001–1.035)
WBC, UA: NONE SEEN WBC/HPF (ref <=5)
YEAST: NONE SEEN [HPF]
pH: 5.5 (ref 5.0–8.0)

## 2016-12-17 LAB — LIPID PANEL
CHOL/HDL RATIO: 4.4 ratio (ref <5.0)
Cholesterol: 215 mg/dL — ABNORMAL HIGH (ref <200)
HDL: 49 mg/dL — AB (ref >50)
LDL CALC: 143 mg/dL — AB (ref <100)
TRIGLYCERIDES: 116 mg/dL (ref <150)
VLDL: 23 mg/dL (ref <30)

## 2016-12-17 LAB — COMPLETE METABOLIC PANEL WITH GFR
ALT: 10 U/L (ref 6–29)
AST: 11 U/L (ref 10–35)
Albumin: 3.6 g/dL (ref 3.6–5.1)
Alkaline Phosphatase: 72 U/L (ref 33–130)
BUN: 7 mg/dL (ref 7–25)
CHLORIDE: 102 mmol/L (ref 98–110)
CO2: 23 mmol/L (ref 20–31)
Calcium: 9 mg/dL (ref 8.6–10.4)
Creat: 0.65 mg/dL (ref 0.50–1.05)
GLUCOSE: 100 mg/dL — AB (ref 65–99)
Potassium: 4.1 mmol/L (ref 3.5–5.3)
SODIUM: 137 mmol/L (ref 135–146)
Total Bilirubin: 0.3 mg/dL (ref 0.2–1.2)
Total Protein: 7.1 g/dL (ref 6.1–8.1)

## 2016-12-17 LAB — PAP IG AND HPV HIGH-RISK: HPV DNA High Risk: NOT DETECTED

## 2016-12-18 ENCOUNTER — Ambulatory Visit: Payer: 59

## 2016-12-18 ENCOUNTER — Other Ambulatory Visit: Payer: Self-pay | Admitting: Family Medicine

## 2016-12-18 MED ORDER — FLUCONAZOLE 150 MG PO TABS: 150.0000 mg | ORAL_TABLET | Freq: Once | ORAL | 0 refills | Status: AC

## 2016-12-30 DIAGNOSIS — Z1212 Encounter for screening for malignant neoplasm of rectum: Secondary | ICD-10-CM | POA: Diagnosis not present

## 2016-12-30 DIAGNOSIS — Z1211 Encounter for screening for malignant neoplasm of colon: Secondary | ICD-10-CM | POA: Diagnosis not present

## 2017-01-06 LAB — COLOGUARD: Cologuard: NEGATIVE

## 2017-01-08 ENCOUNTER — Ambulatory Visit: Payer: 59 | Admitting: Family Medicine

## 2017-04-19 IMAGING — US US ABDOMEN LIMITED
1 series · 14 of 25 positions shown · non-contrast
Comparison: Report from 12/06/2001 ultrasound.

CLINICAL DATA: 50-year-old female with right upper quadrant pain
since this morning.

EXAM:
US ABDOMEN LIMITED - RIGHT UPPER QUADRANT

[Series 1: us abdomen limited · 0.31mm/px · 14 of 38 slices shown]
[im 1/38]
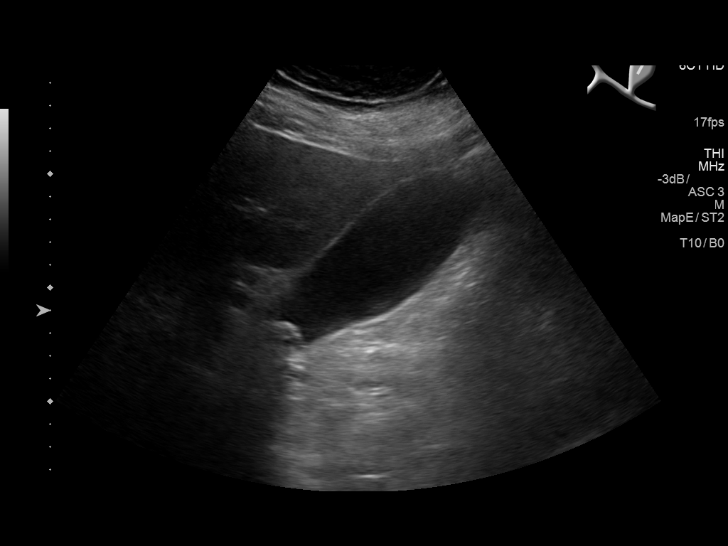
[im 4/38]
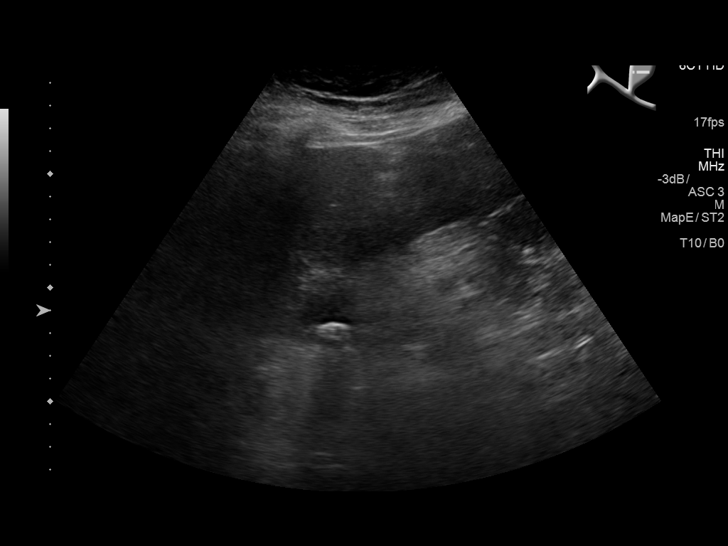
[im 7/38]
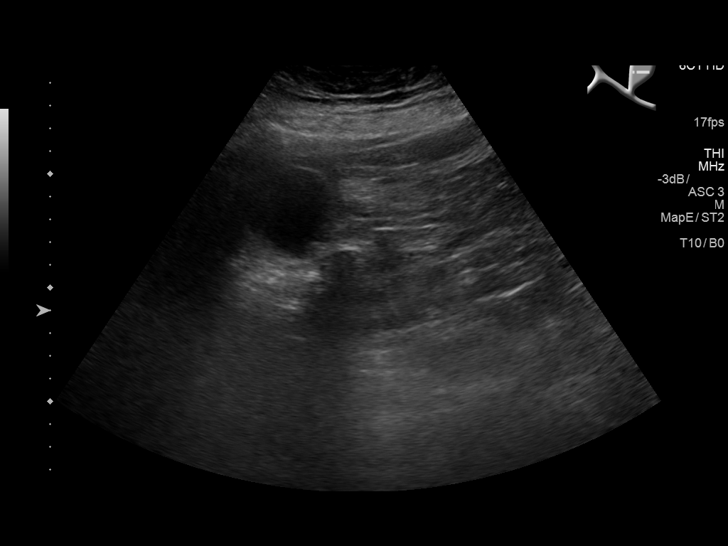
[im 10/38]
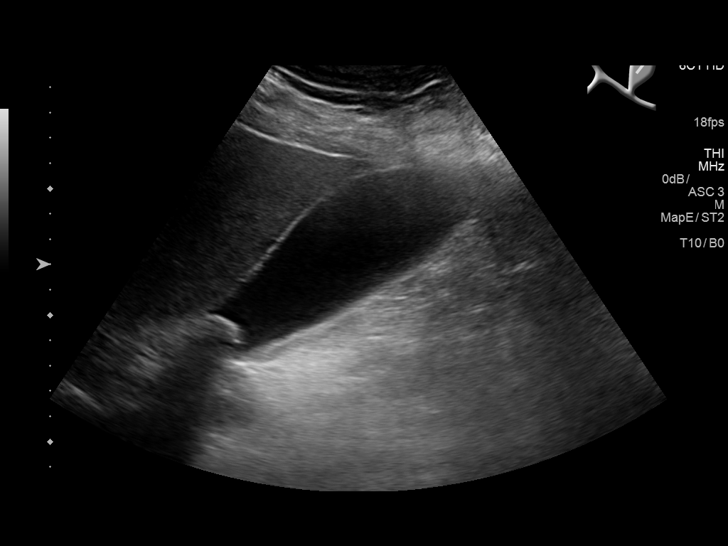
[im 13/38]
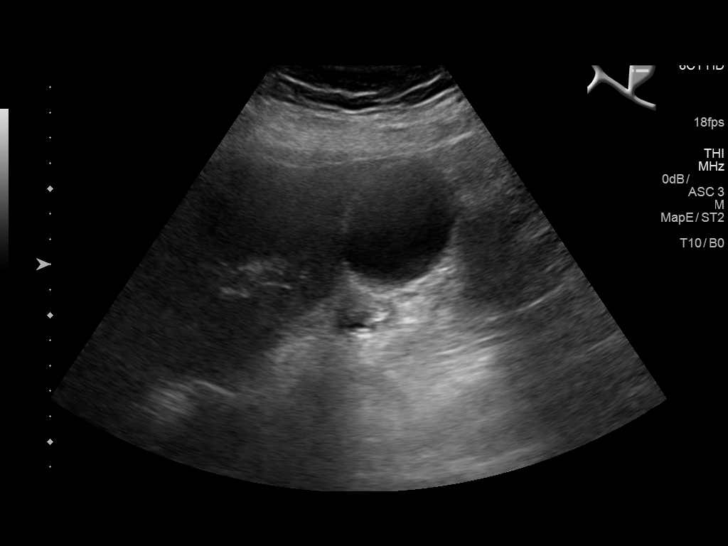
[im 14/38]
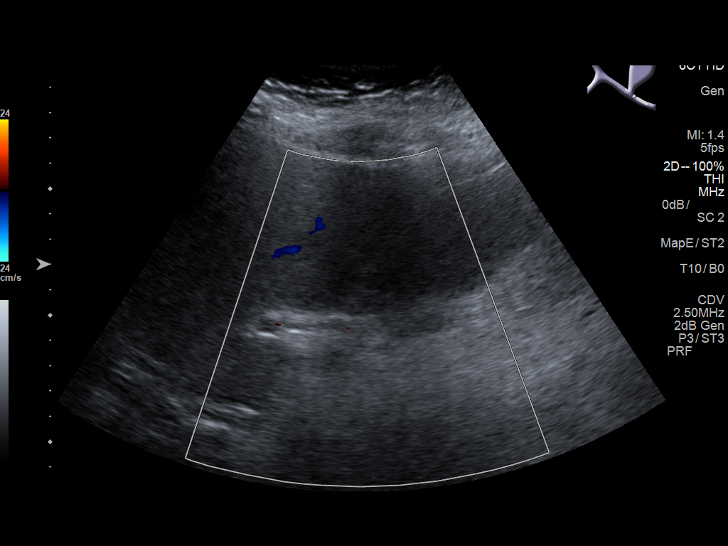
[im 17/38]
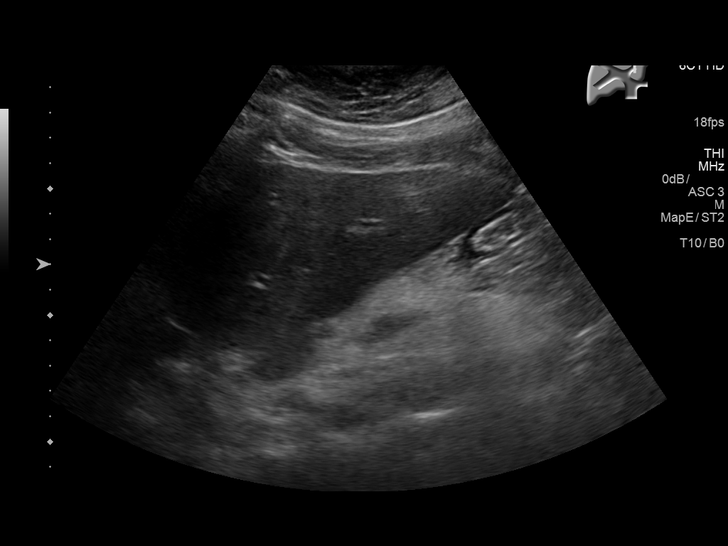
[im 21/38]
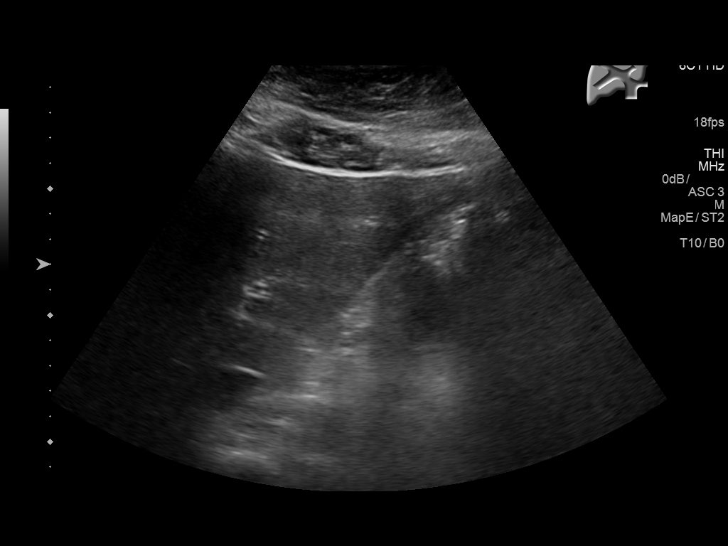
[im 24/38]
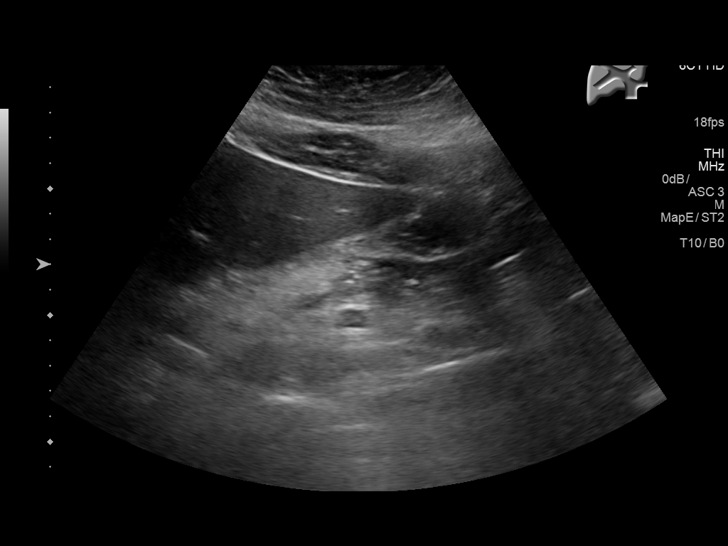
[im 25/38]
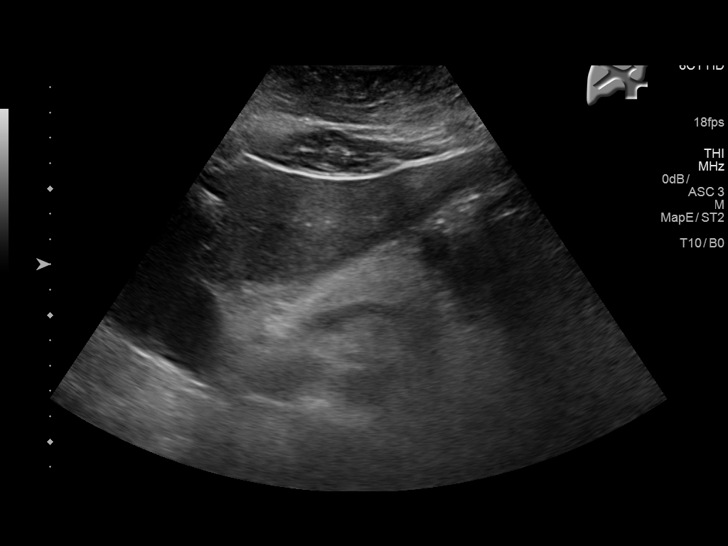
[im 28/38]
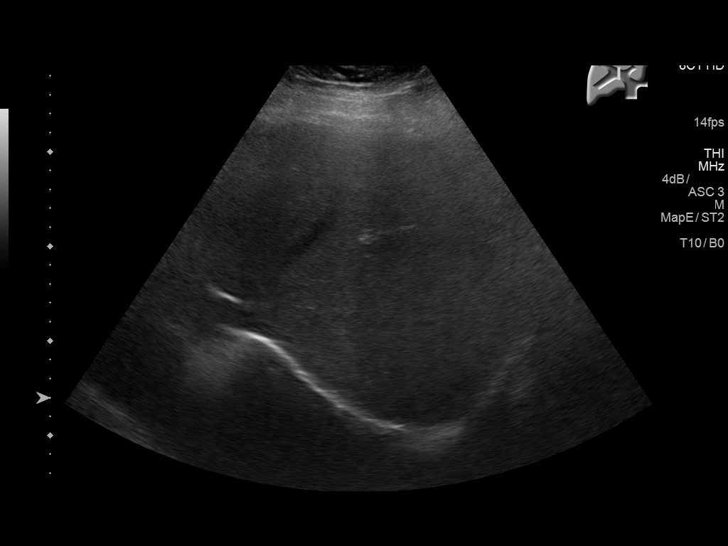
[im 31/38]
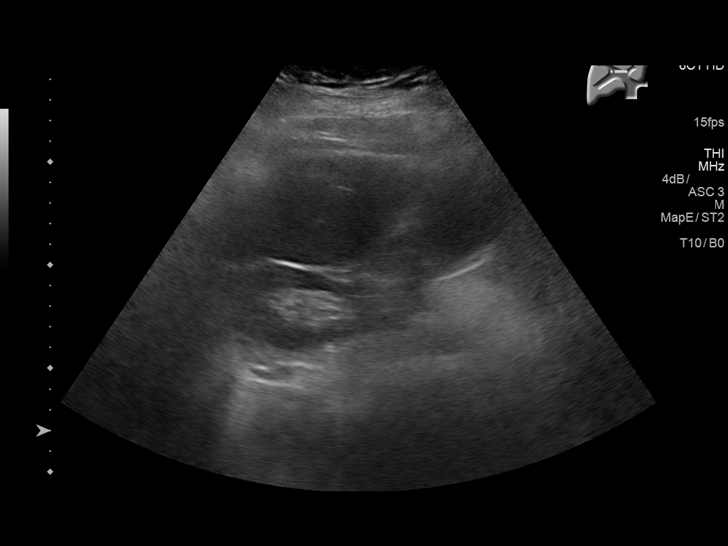
[im 34/38]
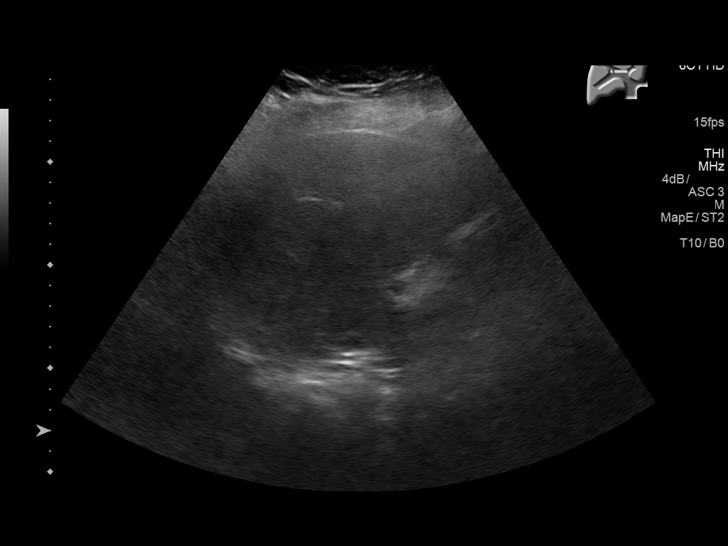
[im 38/38]
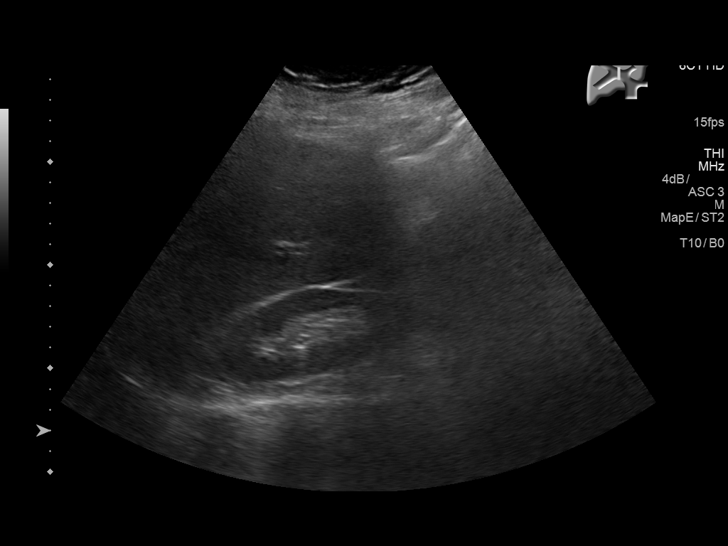

[14 of 25 positions shown; findings below may reference images not displayed]

FINDINGS: Gallbladder:

1.4 cm gallstone lodged in the gallbladder neck region. No
gallbladder wall thickening or pericholecystic fluid. Per ultrasound
technologist, the patient was not tender over this region during
scanning.

Common bile duct:

Diameter: 3.5 mm

Liver:

Slightly heterogeneous with minimal increased echotexture may
represent mild fatty infiltration without focal mass or intrahepatic
biliary duct dilation noted.
IMPRESSION: 1.4 cm gallstone lodged in the gallbladder neck region. No
gallbladder wall thickening or pericholecystic fluid. Per ultrasound
technologist, the patient was not tender over this region during
scanning.

Mild fatty infiltration of the liver may be present.

## 2018-01-18 IMAGING — CR DG CHEST 1V
1 series · 1 of 1 positions shown · non-contrast
Comparison: None.

CLINICAL DATA: Right chest pain radiating to the back. Nausea and
vomiting.

EXAM:
CHEST 1 VIEW

[dg chest 1 view]
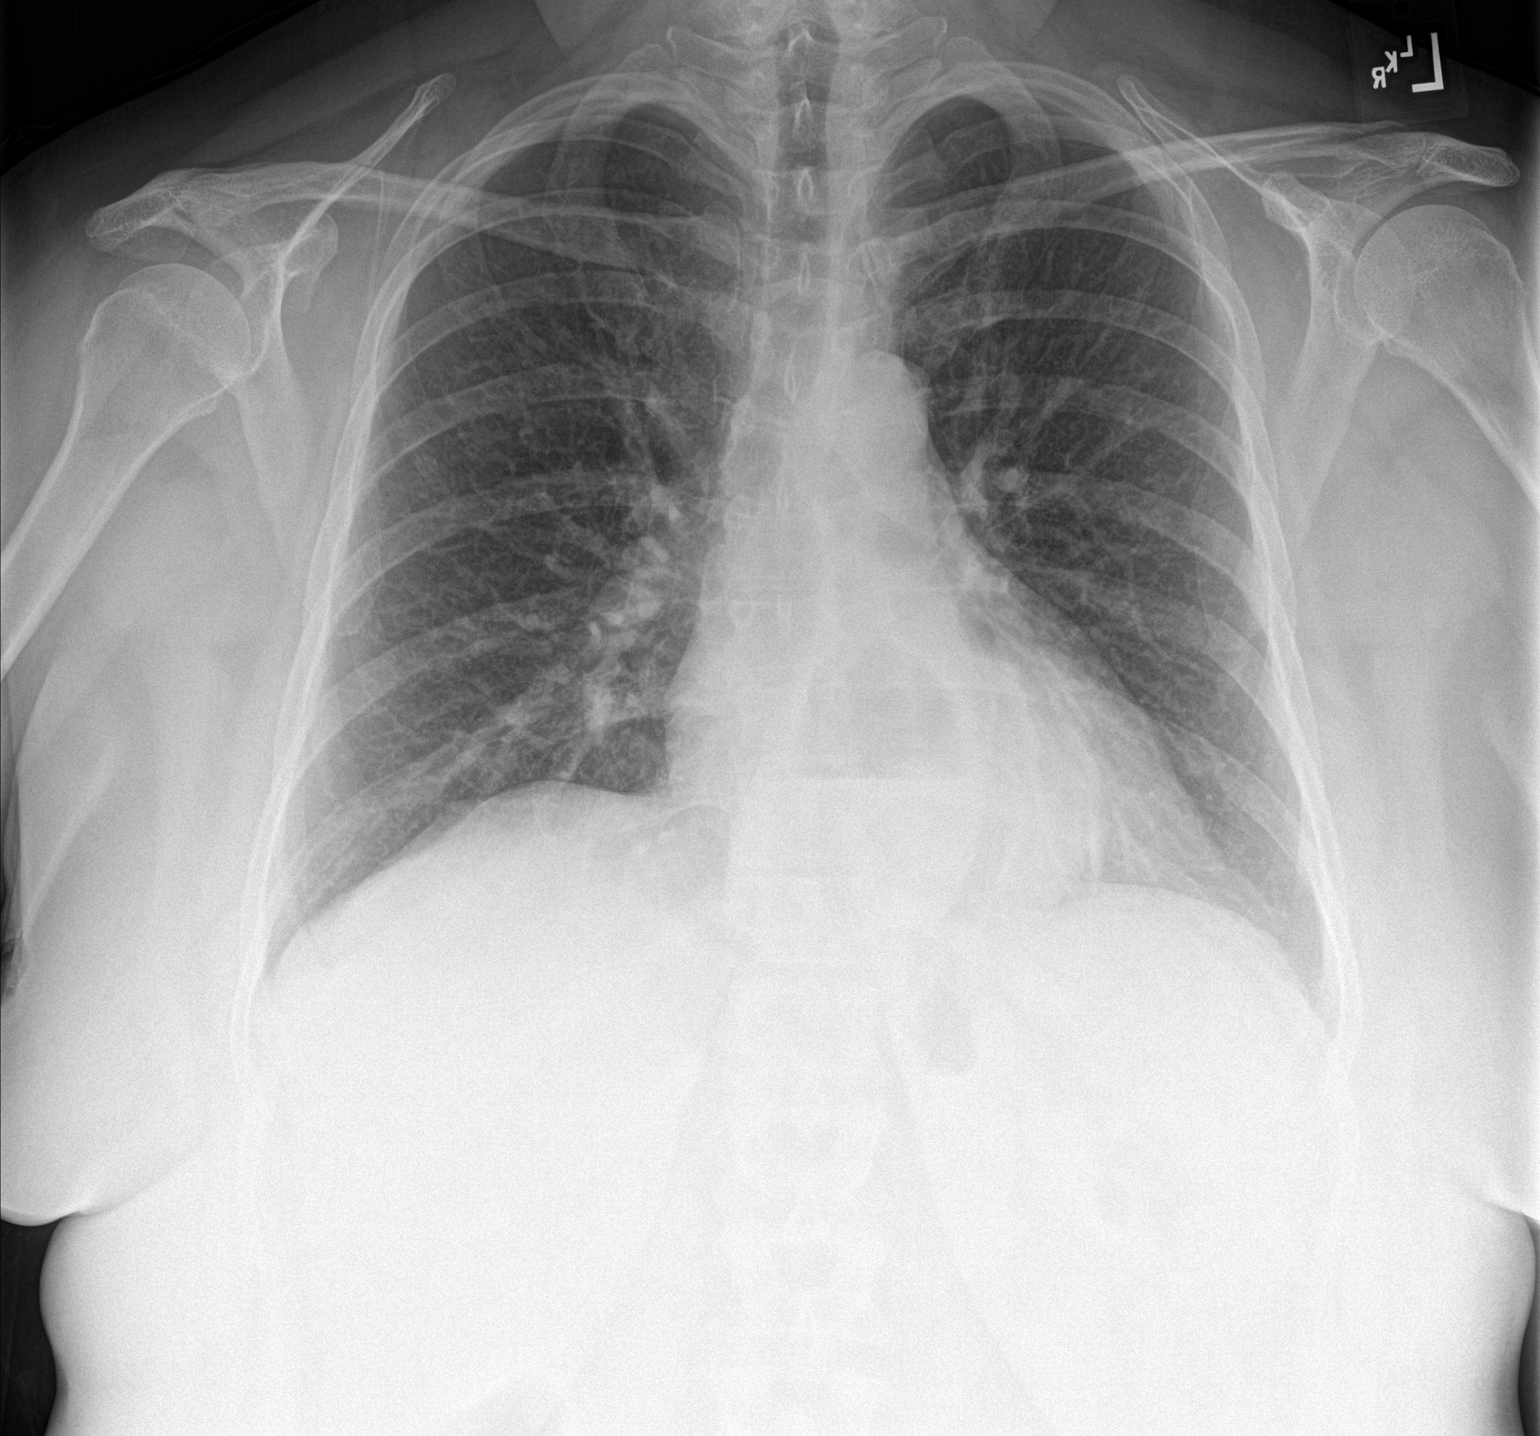

[1 of 1 positions shown; findings below may reference images not displayed]

FINDINGS: Moderate-sized hiatal hernia containing an air- fluid level.

Cardiac and mediastinal margins appear normal. The lungs appear
clear.
IMPRESSION: 1. Moderate-sized hiatal hernia containing an air-fluid level.

## 2018-03-02 ENCOUNTER — Encounter: Payer: Self-pay | Admitting: Family Medicine

## 2018-03-02 ENCOUNTER — Ambulatory Visit (INDEPENDENT_AMBULATORY_CARE_PROVIDER_SITE_OTHER): Payer: 59 | Admitting: Family Medicine

## 2018-03-02 ENCOUNTER — Ambulatory Visit
Admission: RE | Admit: 2018-03-02 | Discharge: 2018-03-02 | Disposition: A | Discharge status: Home / Self Care | Payer: 59 | Source: Ambulatory Visit | Attending: Family Medicine | Admitting: Family Medicine

## 2018-03-02 VITALS — BP 126/74 | HR 82 | Temp 98.2°F | Ht 62.0 in | Wt 186.6 lb

## 2018-03-02 DIAGNOSIS — G8929 Other chronic pain: Secondary | ICD-10-CM | POA: Insufficient documentation

## 2018-03-02 DIAGNOSIS — N926 Irregular menstruation, unspecified: Secondary | ICD-10-CM | POA: Diagnosis not present

## 2018-03-02 DIAGNOSIS — R102 Pelvic and perineal pain unspecified side: Secondary | ICD-10-CM

## 2018-03-02 DIAGNOSIS — M25562 Pain in left knee: Secondary | ICD-10-CM

## 2018-03-02 DIAGNOSIS — M25462 Effusion, left knee: Secondary | ICD-10-CM | POA: Diagnosis not present

## 2018-03-02 DIAGNOSIS — M25561 Pain in right knee: Secondary | ICD-10-CM | POA: Diagnosis not present

## 2018-03-02 DIAGNOSIS — Z1239 Encounter for other screening for malignant neoplasm of breast: Secondary | ICD-10-CM | POA: Diagnosis not present

## 2018-03-02 DIAGNOSIS — N939 Abnormal uterine and vaginal bleeding, unspecified: Secondary | ICD-10-CM

## 2018-03-02 DIAGNOSIS — Z0000 Encounter for general adult medical examination without abnormal findings: Secondary | ICD-10-CM | POA: Diagnosis not present

## 2018-03-02 DIAGNOSIS — M1712 Unilateral primary osteoarthritis, left knee: Secondary | ICD-10-CM | POA: Diagnosis not present

## 2018-03-02 DIAGNOSIS — M170 Bilateral primary osteoarthritis of knee: Secondary | ICD-10-CM | POA: Diagnosis not present

## 2018-03-02 NOTE — Patient Instructions (Addendum)
Go across the street for xrays  We'll have you see the gynecologist  Please call [phone] to schedule your imaging test (pelvic ultrasound) Please wait 2-3 days after the order has been placed to call and get your test scheduled  Keep up the great job with weight loss  Health Maintenance, Female Adopting a healthy lifestyle and getting preventive care can go a long way to promote health and wellness. Talk with your health care provider about what schedule of regular examinations is right for you. This is a good chance for you to check in with your provider about disease prevention and staying healthy. In between checkups, there are plenty of things you can do on your own. Experts have done a lot of research about which lifestyle changes and preventive measures are most likely to keep you healthy. Ask your health care provider for more information. Weight and diet Eat a healthy diet  Be sure to include plenty of vegetables, fruits, low-fat dairy products, and lean protein.  Do not eat a lot of foods high in solid fats, added sugars, or salt.  Get regular exercise. This is one of the most important things you can do for your health. ? Most adults should exercise for at least 150 minutes each week. The exercise should increase your heart rate and make you sweat (moderate-intensity exercise). ? Most adults should also do strengthening exercises at least twice a week. This is in addition to the moderate-intensity exercise.  Maintain a healthy weight  Body mass index (BMI) is a measurement that can be used to identify possible weight problems. It estimates body fat based on height and weight. Your health care provider can help determine your BMI and help you achieve or maintain a healthy weight.  For females 64 years of age and older: ? A BMI below 18.5 is considered underweight. ? A BMI of 18.5 to 24.9 is normal. ? A BMI of 25 to 29.9 is considered overweight. ? A BMI of 30 and above  is considered obese.  Watch levels of cholesterol and blood lipids  You should start having your blood tested for lipids and cholesterol at 53 years of age, then have this test every 5 years.  You may need to have your cholesterol levels checked more often if: ? Your lipid or cholesterol levels are high. ? You are older than 53 years of age. ? You are at high risk for heart disease.  Cancer screening Lung Cancer  Lung cancer screening is recommended for adults 30-52 years old who are at high risk for lung cancer because of a history of smoking.  A yearly low-dose CT scan of the lungs is recommended for people who: ? Currently smoke. ? Have quit within the past 15 years. ? Have at least a 30-pack-year history of smoking. A pack year is smoking an average of one pack of cigarettes a day for 1 year.  Yearly screening should continue until it has been 15 years since you quit.  Yearly screening should stop if you develop a health problem that would prevent you from having lung cancer treatment.  Breast Cancer  Practice breast self-awareness. This means understanding how your breasts normally appear and feel.  It also means doing regular breast self-exams. Let your health care provider know about any changes, no matter how small.  If you are in your 20s or 30s, you should have a clinical breast exam (CBE) by a health care provider every 1-3 years as part  of a regular health exam.  If you are 30 or older, have a CBE every year. Also consider having a breast X-ray (mammogram) every year.  If you have a family history of breast cancer, talk to your health care provider about genetic screening.  If you are at high risk for breast cancer, talk to your health care provider about having an MRI and a mammogram every year.  Breast cancer gene (BRCA) assessment is recommended for women who have family members with BRCA-related cancers. BRCA-related cancers  include: ? Breast. ? Ovarian. ? Tubal. ? Peritoneal cancers.  Results of the assessment will determine the need for genetic counseling and BRCA1 and BRCA2 testing.  Cervical Cancer Your health care provider may recommend that you be screened regularly for cancer of the pelvic organs (ovaries, uterus, and vagina). This screening involves a pelvic examination, including checking for microscopic changes to the surface of your cervix (Pap test). You may be encouraged to have this screening done every 3 years, beginning at age 70.  For women ages 17-65, health care providers may recommend pelvic exams and Pap testing every 3 years, or they may recommend the Pap and pelvic exam, combined with testing for human papilloma virus (HPV), every 5 years. Some types of HPV increase your risk of cervical cancer. Testing for HPV may also be done on women of any age with unclear Pap test results.  Other health care providers may not recommend any screening for nonpregnant women who are considered low risk for pelvic cancer and who do not have symptoms. Ask your health care provider if a screening pelvic exam is right for you.  If you have had past treatment for cervical cancer or a condition that could lead to cancer, you need Pap tests and screening for cancer for at least 20 years after your treatment. If Pap tests have been discontinued, your risk factors (such as having a new sexual partner) need to be reassessed to determine if screening should resume. Some women have medical problems that increase the chance of getting cervical cancer. In these cases, your health care provider may recommend more frequent screening and Pap tests.  Colorectal Cancer  This type of cancer can be detected and often prevented.  Routine colorectal cancer screening usually begins at 53 years of age and continues through 53 years of age.  Your health care provider may recommend screening at an earlier age if you have risk factors  for colon cancer.  Your health care provider may also recommend using home test kits to check for hidden blood in the stool.  A small camera at the end of a tube can be used to examine your colon directly (sigmoidoscopy or colonoscopy). This is done to check for the earliest forms of colorectal cancer.  Routine screening usually begins at age 85.  Direct examination of the colon should be repeated every 5-10 years through 53 years of age. However, you may need to be screened more often if early forms of precancerous polyps or small growths are found.  Skin Cancer  Check your skin from head to toe regularly.  Tell your health care provider about any new moles or changes in moles, especially if there is a change in a mole's shape or color.  Also tell your health care provider if you have a mole that is larger than the size of a pencil eraser.  Always use sunscreen. Apply sunscreen liberally and repeatedly throughout the day.  Protect yourself by wearing long  sleeves, pants, a wide-brimmed hat, and sunglasses whenever you are outside.  Heart disease, diabetes, and high blood pressure  High blood pressure causes heart disease and increases the risk of stroke. High blood pressure is more likely to develop in: ? People who have blood pressure in the high end of the normal range (130-139/85-89 mm Hg). ? People who are overweight or obese. ? People who are African American.  If you are 93-79 years of age, have your blood pressure checked every 3-5 years. If you are 85 years of age or older, have your blood pressure checked every year. You should have your blood pressure measured twice-once when you are at a hospital or clinic, and once when you are not at a hospital or clinic. Record the average of the two measurements. To check your blood pressure when you are not at a hospital or clinic, you can use: ? An automated blood pressure machine at a pharmacy. ? A home blood pressure monitor.  If  you are between 74 years and 26 years old, ask your health care provider if you should take aspirin to prevent strokes.  Have regular diabetes screenings. This involves taking a blood sample to check your fasting blood sugar level. ? If you are at a normal weight and have a low risk for diabetes, have this test once every three years after 53 years of age. ? If you are overweight and have a high risk for diabetes, consider being tested at a younger age or more often. Preventing infection Hepatitis B  If you have a higher risk for hepatitis B, you should be screened for this virus. You are considered at high risk for hepatitis B if: ? You were born in a country where hepatitis B is common. Ask your health care provider which countries are considered high risk. ? Your parents were born in a high-risk country, and you have not been immunized against hepatitis B (hepatitis B vaccine). ? You have HIV or AIDS. ? You use needles to inject street drugs. ? You live with someone who has hepatitis B. ? You have had sex with someone who has hepatitis B. ? You get hemodialysis treatment. ? You take certain medicines for conditions, including cancer, organ transplantation, and autoimmune conditions.  Hepatitis C  Blood testing is recommended for: ? Everyone born from [birth year] through 1966. ? Anyone with known risk factors for hepatitis C.  Sexually transmitted infections (STIs)  You should be screened for sexually transmitted infections (STIs) including gonorrhea and chlamydia if: ? You are sexually active and are younger than 53 years of age. ? You are older than 53 years of age and your health care provider tells you that you are at risk for this type of infection. ? Your sexual activity has changed since you were last screened and you are at an increased risk for chlamydia or gonorrhea. Ask your health care provider if you are at risk.  If you do not have HIV, but are at risk, it may be recommended  that you take a prescription medicine daily to prevent HIV infection. This is called pre-exposure prophylaxis (PrEP). You are considered at risk if: ? You are sexually active and do not regularly use condoms or know the HIV status of your partner(s). ? You take drugs by injection. ? You are sexually active with a partner who has HIV.  Talk with your health care provider about whether you are at high risk of being infected with HIV. If  you choose to begin PrEP, you should first be tested for HIV. You should then be tested every 3 months for as long as you are taking PrEP. Pregnancy  If you are premenopausal and you may become pregnant, ask your health care provider about preconception counseling.  If you may become pregnant, take 400 to 800 micrograms (mcg) of folic acid every day.  If you want to prevent pregnancy, talk to your health care provider about birth control (contraception). Osteoporosis and menopause  Osteoporosis is a disease in which the bones lose minerals and strength with aging. This can result in serious bone fractures. Your risk for osteoporosis can be identified using a bone density scan.  If you are 51 years of age or older, or if you are at risk for osteoporosis and fractures, ask your health care provider if you should be screened.  Ask your health care provider whether you should take a calcium or vitamin D supplement to lower your risk for osteoporosis.  Menopause may have certain physical symptoms and risks.  Hormone replacement therapy may reduce some of these symptoms and risks. Talk to your health care provider about whether hormone replacement therapy is right for you. Follow these instructions at home:  Schedule regular health, dental, and eye exams.  Stay current with your immunizations.  Do not use any tobacco products including cigarettes, chewing tobacco, or electronic cigarettes.  If you are pregnant, do not drink alcohol.  If you are  breastfeeding, limit how much and how often you drink alcohol.  Limit alcohol intake to no more than 1 drink per day for nonpregnant women. One drink equals 12 ounces of beer, 5 ounces of wine, or 1 ounces of hard liquor.  Do not use street drugs.  Do not share needles.  Ask your health care provider for help if you need support or information about quitting drugs.  Tell your health care provider if you often feel depressed.  Tell your health care provider if you have ever been abused or do not feel safe at home. This information is not intended to replace advice given to you by your health care provider. Make sure you discuss any questions you have with your health care provider. Document Released: 12/01/2010 Document Revised: 10/24/2015 Document Reviewed: 02/19/2015 Elsevier Interactive Patient Education  [employer].

## 2018-03-02 NOTE — Assessment & Plan Note (Signed)
USPSTF grade A and B recommendations reviewed with patient; age-appropriate recommendations, preventive care, screening tests, etc discussed and encouraged; healthy living encouraged; see AVS for patient education given to patient  

## 2018-03-02 NOTE — Progress Notes (Signed)
BP 126/74   Pulse 82   Temp 98.2 F (36.8 C) (Oral)   Ht 5\' 2"  (1.575 m)   Wt 186 lb 9.6 oz (84.6 kg)   LMP 01/21/2018   SpO2 98%   BMI 34.13 kg/m    Subjective:    Patient ID: Hailey Sheppard, female    DOB: Oct 11, 1965, 52 y.o.   MRN: [id number]  HPI: Hailey Sheppard is a 52 y.o. female  Chief Complaint  Patient presents with  . Annual Exam  . Menstrual Problem    5-6 weeks last period April thought she was at menopause  . Knee Pain    left onset started new job climbing up ladder felt like she pulled something.  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 active, going up and down ladders; felt like she did something, pulled something; still bugs her; on the sides both sides; no crunching or locking or popping; swelled up, then went down; happened in May; tried heat, advil; does not like to take much; still hurting every so often, worse when on it a lot;   Depression screen [facility] 2/9 03/02/2018 12/16/2016  Decreased Interest 0 0  Down, Depressed, Hopeless 0 0  PHQ - 2 Score 0 0  Altered sleeping 0 -  Tired, decreased energy 0 -  Change in appetite 0 -  Feeling bad or failure about yourself  0 -  Trouble concentrating 0 -  Moving slowly or fidgety/restless 0 -  Suicidal thoughts 0 -  PHQ-9 Score 0 -  Difficult doing work/chores Not difficult at all -   Fall Risk  03/02/2018 12/16/2016  Falls in the past year? No No    Relevant past medical, surgical, family and social history reviewed Past Medical History:  Diagnosis Date  .  Allergy   . Anxiety   . Complication of anesthesia    pt panics easily feeling like she cant breathe  . GERD (gastroesophageal reflux disease)   . Headache    occ-migraines  . Hypertension    PCP prescribed pt bp med but pt never took it   Past Surgical History:  Procedure Laterality Date  . APPENDECTOMY  1980   Open with Bowel Resection due to Perforation  . CHOLECYSTECTOMY N/A 06/17/2016   Procedure: LAPAROSCOPIC CHOLECYSTECTOMY;  Surgeon: Florene Glen, MD;  Location: [facility] ORS;  Service: General;  Laterality: N/A;  . COLON SURGERY  1980   Bowel Resection during Appendectomy secondary to Perforation  . GALLBLADDER SURGERY     januray 17th 2018   Family History  Problem Relation Age of Onset  . Diabetes Mother   . Obesity Mother   . Diabetes Father   . Gallbladder disease Brother   . Ovarian cancer Maternal Grandmother   . Heart disease Maternal Grandfather   . Gallbladder disease Maternal Grandfather   . Stroke Paternal Grandmother   . Kidney cancer Paternal Grandmother    Social History   Tobacco Use  . Smoking status: Never Smoker  . Smokeless tobacco: Never  Used  Substance Use Topics  . Alcohol use: Yes    Comment: Occasional  . Drug use: No     Office Visit from 03/02/2018 in [facility]  AUDIT-C Score  1      Interim medical history since last visit reviewed. Allergies and medications reviewed  Review of Systems Per HPI unless specifically indicated above     Objective:    BP 126/74   Pulse 82   Temp 98.2 F (36.8 C) (Oral)   Ht 5\' 2"  (1.575 m)   Wt 186 lb 9.6 oz (84.6 kg)   LMP 01/21/2018   SpO2 98%   BMI 34.13 kg/m   Wt Readings from Last 3 Encounters:  03/02/18 186 lb 9.6 oz (84.6 kg)  12/16/16 218 lb 14.4 oz (99.3 kg)  07/02/16 222 lb (100.7 kg)    Physical Exam  Constitutional: She appears well-developed and well-nourished. No distress.  HENT:  Head: Normocephalic and atraumatic.  Eyes: EOM are normal. No  scleral icterus.  Neck: No thyromegaly present.  Cardiovascular: Normal rate, regular rhythm and normal heart sounds.  No murmur heard. Pulmonary/Chest: Effort normal and breath sounds normal. No respiratory distress. She has no wheezes.  Abdominal: Soft. Bowel sounds are normal. She exhibits no distension.  Musculoskeletal: She exhibits no edema.  Neurological: She is alert.  Skin: Skin is warm and dry. She is not diaphoretic. No pallor.  Psychiatric: She has a normal mood and affect. Her behavior is normal. Judgment and thought content normal.       Assessment & Plan:   Problem List Items Addressed This Visit      Other   Preventative health care    USPSTF grade A and B recommendations reviewed with patient; age-appropriate recommendations, preventive care, screening tests, etc discussed and encouraged; healthy living encouraged; see AVS for patient education given to patient       Relevant Orders   CBC with Differential/Platelet (Completed)   COMPLETE METABOLIC PANEL WITH GFR (Completed)   Lipid panel (Completed)   TSH (Completed)    Other Visit Diagnoses    Screening for breast cancer    -  Primary   Relevant Orders   MM 3D SCREEN BREAST BILATERAL   Chronic pain of both knees       Relevant Orders   DG Knee Complete 4 Views Left (Completed)   DG Knee Complete 4 Views Right (Completed)   Pelvic pain in female       Relevant Orders   US Pelvis Complete (Completed)   US Transvaginal Non-OB (Completed)   Ambulatory referral to Obstetrics / Gynecology   Abnormal uterine bleeding       Relevant Orders   US Pelvis Complete (Completed)   US Transvaginal Non-OB (Completed)   Ambulatory referral to Obstetrics / Gynecology   Irregular bleeding       Relevant Orders   FSH/LH (Completed)       Follow up plan: Return in about 1 year (around 03/03/2019) for complete physical.  An after-visit summary was printed and given to the patient at [place].  Please see the patient  instructions which may contain other information and recommendations beyond what is mentioned above in the assessment and plan.  No orders of the defined types were placed in this encounter.   Orders Placed This Encounter  Procedures  . MM 3D SCREEN BREAST BILATERAL  . DG Knee Complete 4 Views Left  . DG Knee Complete 4 Views Right  .  US Pelvis Complete  . US Transvaginal Non-OB  . CBC with Differential/Platelet  . COMPLETE METABOLIC PANEL WITH GFR  . Lipid panel  . TSH  . FSH/LH  . Ambulatory referral to Obstetrics / Gynecology

## 2018-03-03 LAB — COMPLETE METABOLIC PANEL WITH GFR
AG RATIO: 1.3 (calc) (ref 1.0–2.5)
ALBUMIN MSPROF: 3.8 g/dL (ref 3.6–5.1)
ALT: 14 U/L (ref 6–29)
AST: 13 U/L (ref 10–35)
Alkaline phosphatase (APISO): 60 U/L (ref 33–130)
BILIRUBIN TOTAL: 0.4 mg/dL (ref 0.2–1.2)
BUN: 12 mg/dL (ref 7–25)
CALCIUM: 9.1 mg/dL (ref 8.6–10.4)
CO2: 29 mmol/L (ref 20–32)
Chloride: 103 mmol/L (ref 98–110)
Creat: 0.83 mg/dL (ref 0.50–1.05)
GFR, EST AFRICAN AMERICAN: 93 mL/min/{1.73_m2} (ref >=60)
GFR, EST NON AFRICAN AMERICAN: 81 mL/min/{1.73_m2} (ref >=60)
Globulin: 2.9 g/dL (calc) (ref 1.9–3.7)
Glucose, Bld: 80 mg/dL (ref 65–99)
POTASSIUM: 3.9 mmol/L (ref 3.5–5.3)
Sodium: 138 mmol/L (ref 135–146)
TOTAL PROTEIN: 6.7 g/dL (ref 6.1–8.1)

## 2018-03-03 LAB — CBC WITH DIFFERENTIAL/PLATELET
Basophils Absolute: 49 cells/uL (ref 0–200)
Basophils Relative: 1 %
EOS ABS: 59 {cells}/uL (ref 15–500)
Eosinophils Relative: 1.2 %
HEMATOCRIT: 41.1 % (ref 35.0–45.0)
HEMOGLOBIN: 13.6 g/dL (ref 11.7–15.5)
LYMPHS ABS: 1470 {cells}/uL (ref 850–3900)
MCH: 29.1 pg (ref 27.0–33.0)
MCHC: 33.1 g/dL (ref 32.0–36.0)
MCV: 88 fL (ref 80.0–100.0)
MPV: 11.2 fL (ref 7.5–12.5)
Monocytes Relative: 6 %
NEUTROS ABS: 3028 {cells}/uL (ref 1500–7800)
Neutrophils Relative %: 61.8 %
PLATELETS: 226 10*3/uL (ref 140–400)
RBC: 4.67 10*6/uL (ref 3.80–5.10)
RDW: 13.2 % (ref 11.0–15.0)
Total Lymphocyte: 30 %
WBC: 4.9 10*3/uL (ref 3.8–10.8)
WBCMIX: 294 {cells}/uL (ref 200–950)

## 2018-03-03 LAB — LIPID PANEL
Cholesterol: 207 mg/dL — ABNORMAL HIGH (ref <200)
HDL: 54 mg/dL (ref >50)
LDL CHOLESTEROL (CALC): 138 mg/dL — AB
NON-HDL CHOLESTEROL (CALC): 153 mg/dL — AB (ref <130)
TRIGLYCERIDES: 55 mg/dL (ref <150)
Total CHOL/HDL Ratio: 3.8 (calc) (ref <5.0)

## 2018-03-03 LAB — FSH/LH
FSH: 58.1 m[IU]/mL
LH: 44.5 m[IU]/mL

## 2018-03-03 LAB — TSH: TSH: 0.93 m[IU]/L

## 2018-03-07 ENCOUNTER — Telehealth: Payer: Self-pay | Admitting: Family Medicine

## 2018-03-07 ENCOUNTER — Encounter: Payer: Self-pay | Admitting: Family Medicine

## 2018-03-07 ENCOUNTER — Ambulatory Visit
Admission: RE | Admit: 2018-03-07 | Discharge: 2018-03-07 | Disposition: A | Discharge status: Home / Self Care | Payer: 59 | Source: Ambulatory Visit | Attending: Family Medicine | Admitting: Family Medicine

## 2018-03-07 DIAGNOSIS — R102 Pelvic and perineal pain: Secondary | ICD-10-CM | POA: Diagnosis not present

## 2018-03-07 DIAGNOSIS — M1711 Unilateral primary osteoarthritis, right knee: Secondary | ICD-10-CM | POA: Insufficient documentation

## 2018-03-07 DIAGNOSIS — N939 Abnormal uterine and vaginal bleeding, unspecified: Secondary | ICD-10-CM | POA: Insufficient documentation

## 2018-03-07 DIAGNOSIS — M1712 Unilateral primary osteoarthritis, left knee: Secondary | ICD-10-CM | POA: Insufficient documentation

## 2018-03-07 DIAGNOSIS — M25462 Effusion, left knee: Secondary | ICD-10-CM | POA: Insufficient documentation

## 2018-03-07 DIAGNOSIS — D252 Subserosal leiomyoma of uterus: Secondary | ICD-10-CM | POA: Diagnosis not present

## 2018-03-07 NOTE — Telephone Encounter (Signed)
Copied from [place] [id number]. Topic: Quick Communication - See Telephone Encounter >> Mar 07, 2018  1:32 PM Blase Mess A wrote: CRM for notification. See Telephone encounter for: 03/07/18.  Patient is calling to let York Cerise know that she is going to work on her cholestoral weight loss and excerise. Patient has lost 40 lbs and 19 inches.

## 2018-03-09 ENCOUNTER — Ambulatory Visit: Payer: Self-pay | Admitting: Obstetrics & Gynecology

## 2018-03-10 ENCOUNTER — Telehealth: Payer: Self-pay | Admitting: Family Medicine

## 2018-03-10 NOTE — Telephone Encounter (Signed)
I called patient to discuss her ultrasound report I reached voicemail; left message that I was calling and would try her tomorrow I told her no need to call me back, as it's almost 5 pm and I'll try her tomorrow No CRM generated, as I want to give her the results myself

## 2018-03-14 NOTE — Telephone Encounter (Signed)
I am out of the office, so staff will contact her today with results; referral to GYN

## 2018-03-24 NOTE — Telephone Encounter (Signed)
Pt is calling to check on the status of referral to GYN.

## 2018-03-27 DIAGNOSIS — J0140 Acute pansinusitis, unspecified: Secondary | ICD-10-CM | POA: Diagnosis not present

## 2018-04-14 ENCOUNTER — Encounter: Payer: 59 | Admitting: Obstetrics & Gynecology

## 2018-04-22 ENCOUNTER — Encounter: Payer: 59 | Admitting: Obstetrics & Gynecology

## 2018-06-16 ENCOUNTER — Encounter: Payer: Self-pay | Admitting: Obstetrics & Gynecology

## 2018-06-16 ENCOUNTER — Other Ambulatory Visit (HOSPITAL_COMMUNITY)
Admission: RE | Admit: 2018-06-16 | Discharge: 2018-06-16 | Disposition: A | Discharge status: Home / Self Care | Payer: 59 | Source: Ambulatory Visit | Attending: Obstetrics & Gynecology | Admitting: Obstetrics & Gynecology

## 2018-06-16 ENCOUNTER — Ambulatory Visit (INDEPENDENT_AMBULATORY_CARE_PROVIDER_SITE_OTHER): Payer: 59 | Admitting: Obstetrics & Gynecology

## 2018-06-16 VITALS — BP 138/80 | Ht 62.0 in | Wt 182.0 lb

## 2018-06-16 DIAGNOSIS — Z124 Encounter for screening for malignant neoplasm of cervix: Secondary | ICD-10-CM | POA: Insufficient documentation

## 2018-06-16 DIAGNOSIS — N926 Irregular menstruation, unspecified: Secondary | ICD-10-CM | POA: Diagnosis not present

## 2018-06-16 DIAGNOSIS — N858 Other specified noninflammatory disorders of uterus: Secondary | ICD-10-CM

## 2018-06-16 NOTE — Progress Notes (Signed)
CONSULT: Dr LADA REASON for CONSULT: Prolonged abnormal bleeding and uterine mass  Dysfunctional Uterine Bleeding Patient is a 53 yo G0 WF who complains of irregular menses. She is NOT menopausal as she continues w periods every 2-3 months, although they are light and for just a few days.  However, recently in Oct-Nov 2019 she had 6 weeks of prolonged bleeding.   She does have hot flashes and night sweats.  She has intermittent LLQ pressure or mild pain, no radiation or assoc sx's or modifiers.  She has FH Ovarian cancer.  Recent US (03/2018) EXAM: TRANSABDOMINAL AND TRANSVAGINAL ULTRASOUND OF PELVIS  TECHNIQUE: Both transabdominal and transvaginal ultrasound examinations of the pelvis were performed. Transabdominal technique was performed for global imaging of the pelvis including uterus, ovaries, adnexal regions, and pelvic cul-de-sac. It was necessary to proceed with endovaginal exam following the transabdominal exam to visualize the uterus, endometrium, ovaries, adnexal regions.  COMPARISON:  None  FINDINGS: Uterus  Measurements: 8.0 x 4.3 x 5.5 centimeters. Within the central uterine fundus, there is a heterogeneous echogenic mass measuring 1.5 x 1.3 x 2.2 centimeters. This mass demonstrates internal blood flow on Doppler evaluation and is likely contiguous with the endometrium.  Endometrium  Thickness: Normal endometrium is not well seen.  See above.  Right ovary  Measurements: 2.8 x 1.6 x 2.3 centimeters. Normal appearance/no adnexal mass.  Left ovary  Measurements: 2.8 x 1.9 x 1.9 centimeter. Normal appearance/no adnexal mass  Other findings  No abnormal free fluid.  IMPRESSION: 1. Heterogeneous echogenic mass measuring 2.2 centimeters within the central fundal region of the uterus. 2. Considerations include a submucosal fibroid or endometrial mass, such as polyp, endometrial hyperplasia, or endometrial carcinoma. The mass is vascular on  Doppler evaluation. 3. Consider further evaluation with sonohysterogram for confirmation prior to hysteroscopy. Endometrial sampling should also be considered if patient is at high risk for endometrial carcinoma. (Ref: Radiological Reasoning: Algorithmic Workup of Abnormal Vaginal Bleeding with Endovaginal Sonography and Sonohysterography. AJR 2008; 297:L89-21)   Electronically Signed   By: Nolon Nations M.D.  PMHx: She  has a past medical history of Allergy, Anxiety, Complication of anesthesia, GERD (gastroesophageal reflux disease), Headache, and Hypertension. Also,  has a past surgical history that includes Appendectomy (1980); Colon surgery (1980); Cholecystectomy (N/A, 06/17/2016); and Gallbladder surgery., family history includes Diabetes in her father and mother; Gallbladder disease in her brother and maternal grandfather; Heart disease in her maternal grandfather; Kidney cancer in her paternal grandmother; Obesity in her mother; Ovarian cancer in her maternal grandmother; Stroke in her paternal grandmother.,  reports that she has never smoked. She has never used smokeless tobacco. She reports current alcohol use. She reports that she does not use drugs.  She has a current medication list which includes the following prescription(s): acetaminophen, cholecalciferol, esomeprazole, ibuprofen, loratadine, montelukast, daily multiple weight loss, phenylephrine-acetaminophen, polyethylene glycol powder, pramox-pe-glycerin-petrolatum, shark liver oil-cocoa butter, and triamcinolone acetonide. Also, is allergic to erythromycin.  Review of Systems  Constitutional: Negative for chills, fever and malaise/fatigue.  HENT: Negative for congestion, sinus pain and sore throat.   Eyes: Negative for blurred vision and pain.  Respiratory: Negative for cough and wheezing.   Cardiovascular: Negative for chest pain and leg swelling.  Gastrointestinal: Negative for abdominal pain, constipation, diarrhea,  heartburn, nausea and vomiting.  Genitourinary: Negative for dysuria, frequency, hematuria and urgency.  Musculoskeletal: Negative for back pain, joint pain, myalgias and neck pain.  Skin: Negative for itching and rash.  Neurological: Negative for dizziness, tremors and  weakness.  Endo/Heme/Allergies: Does not bruise/bleed easily.  Psychiatric/Behavioral: Negative for depression. The patient is not nervous/anxious and does not have insomnia.    Objective: BP 138/80   Ht 5\' 2"  (1.575 m)   Wt 182 lb (82.6 kg)   BMI 33.29 kg/m  Physical Exam Constitutional:      General: She is not in acute distress.    Appearance: She is well-developed.  Genitourinary:     Pelvic exam was performed with patient supine.     Vagina, uterus and rectum normal.     No lesions in the vagina.     No vaginal bleeding.     No cervical motion tenderness, friability, lesion or polyp.     Uterus is mobile.     Uterus is not enlarged.     No uterine mass detected.    Uterus is midaxial.     No right or left adnexal mass present.     Right adnexa not tender.     Left adnexa not tender.     Genitourinary Comments: No mass Vagina and cervix normal appearance  HENT:     Head: Normocephalic and atraumatic. No laceration.     Right Ear: Hearing normal.     Left Ear: Hearing normal.     Mouth/Throat:     Pharynx: Uvula midline.  Eyes:     Pupils: Pupils are equal, round, and reactive to light.  Neck:     Musculoskeletal: Normal range of motion and neck supple.     Thyroid: No thyromegaly.  Cardiovascular:     Rate and Rhythm: Normal rate and regular rhythm.     Heart sounds: No murmur. No friction rub. No gallop.   Pulmonary:     Effort: Pulmonary effort is normal. No respiratory distress.     Breath sounds: Normal breath sounds. No wheezing.  Chest:     Breasts:        Right: No mass, skin change or tenderness.        Left: No mass, skin change or tenderness.  Abdominal:     General: Bowel sounds  are normal. There is no distension.     Palpations: Abdomen is soft.     Tenderness: There is no abdominal tenderness. There is no rebound.  Musculoskeletal: Normal range of motion.  Neurological:     Mental Status: She is alert and oriented to person, place, and time.     Cranial Nerves: No cranial nerve deficit.  Skin:    General: Skin is warm and dry.  Psychiatric:        Judgment: Judgment normal.  Vitals signs reviewed.   ASSESSMENT/PLAN:    Irregular menses    -  Primary   Relevant Orders   Surgical pathology- EMB done today see below   Uterine mass       Relevant Orders   Surgical pathology   Screening for cervical cancer       Relevant Orders   Cytology - PAP    Plan SHG if repeat prolonged bleeding episode occurs Plan hysteroscopy or other procedure if pathology abnormal, based on results Discussed fibroids, polyps, menopause Risks of cancer also counseled   Endometrial Biopsy After discussion with the patient regarding her abnormal uterine bleeding I recommended that she proceed with an endometrial biopsy for further diagnosis. The risks, benefits, alternatives, and indications for an endometrial biopsy were discussed with the patient in detail. She understood the risks including infection, bleeding, cervical laceration and uterine perforation.  Verbal consent was obtained.   PROCEDURE NOTE:  Pipelle endometrial biopsy was performed using aseptic technique with iodine preparation.  The uterus was sounded to a length of 7 cm.  Adequate sampling was obtained with minimal blood loss.  The patient tolerated the procedure well.  Disposition will be pending pathology.  Barnett Applebaum, MD, [facility], [facility] 06/16/2018  3:35 PM

## 2018-06-16 NOTE — Patient Instructions (Signed)
Endometrial Biopsy, Care After This sheet gives you information about how to care for yourself after your procedure. Your health care provider may also give you more specific instructions. If you have problems or questions, contact your health care provider. What can I expect after the procedure? After the procedure, it is common to have:  Mild cramping.  A small amount of vaginal bleeding for a few days. This is normal. Follow these instructions at home:   Take over-the-counter and prescription medicines only as told by your health care provider.  Do not douche, use tampons, or have sexual intercourse until your health care provider approves.  Return to your normal activities as told by your health care provider. Ask your health care provider what activities are safe for you.  Follow instructions from your health care provider about any activity restrictions, such as restrictions on strenuous exercise or heavy lifting. Contact a health care provider if:  You have heavy bleeding, or bleed for longer than 2 days after the procedure.  You have bad smelling discharge from your vagina.  You have a fever or chills.  You have a burning sensation when urinating or you have difficulty urinating.  You have severe pain in your lower abdomen. Get help right away if:  You have severe cramps in your stomach or back.  You pass large blood clots.  Your bleeding increases.  You become weak or light-headed, or you pass out. Summary  After the procedure, it is common to have mild cramping and a small amount of vaginal bleeding for a few days.  Do not douche, use tampons, or have sexual intercourse until your health care provider approves.  Return to your normal activities as told by your health care provider. Ask your health care provider what activities are safe for you. This information is not intended to replace advice given to you by your health care provider. Make sure you discuss any  questions you have with your health care provider. Document Released: 03/08/2013 Document Revised: 06/03/2016 Document Reviewed: 06/03/2016 Elsevier Interactive Patient Education  2019 Elsevier Inc.   Uterine Fibroids  Uterine fibroids (leiomyomas) are noncancerous (benign) tumors that can develop in the uterus. Fibroids may also develop in the fallopian tubes, cervix, or tissues (ligaments) near the uterus. You may have one or many fibroids. Fibroids vary in size, weight, and where they grow in the uterus. Some can become quite large. Most fibroids do not require medical treatment. What are the causes? The cause of this condition is not known. What increases the risk? You are more likely to develop this condition if you:  Are in your 30s or 40s and have not gone through menopause.  Have a family history of this condition.  Are of African-American descent.  Had your first period at an early age (early menarche).  Have not had any children (nulliparity).  Are overweight or obese. What are the signs or symptoms? Many women do not have any symptoms. Symptoms of this condition may include:  Heavy menstrual bleeding.  Bleeding or spotting between periods.  Pain and pressure in the pelvic area, between the hips.  Bladder problems, such as needing to urinate urgently or more often than usual.  Inability to have children (infertility).  Failure to carry pregnancy to term (miscarriage). How is this diagnosed? This condition may be diagnosed based on:  Your symptoms and medical history.  A physical exam.  A pelvic exam that includes feeling for any tumors.  Imaging tests, such as  ultrasound or MRI. How is this treated? Treatment for this condition may include:  Seeing your health care provider for follow-up visits to monitor your fibroids for any changes.  Taking NSAIDs such as ibuprofen, naproxen, or aspirin to reduce pain.  Hormone medicines. These may be taken as a  pill, given in an injection, or delivered by a T-shaped device that is inserted into the uterus (intrauterine device, IUD).  Surgery to remove one of the following: ? The fibroids (myomectomy). Your health care provider may recommend this if fibroids affect your fertility and you want to become pregnant. ? The uterus (hysterectomy). ? Blood supply to the fibroids (uterine artery embolization). Follow these instructions at home:  Take over-the-counter and prescription medicines only as told by your health care provider.  Ask your health care provider if you should take iron pills or eat more iron-rich foods, such as dark green, leafy vegetables. Heavy menstrual bleeding can cause low iron levels.  If directed, apply heat to your back or abdomen to reduce pain. Use the heat source that your health care provider recommends, such as a moist heat pack or a heating pad. ? Place a towel between your skin and the heat source. ? Leave the heat on for 20-30 minutes. ? Remove the heat if your skin turns bright red. This is especially important if you are unable to feel pain, heat, or cold. You may have a greater risk of getting burned.  Pay close attention to your menstrual cycle. Tell your health care provider about any changes, such as: ? Increased blood flow that requires you to use more pads or tampons than usual. ? A change in the number of days that your period lasts. ? A change in symptoms that are associated with your period, such as back pain or cramps in your abdomen.  Keep all follow-up visits as told by your health care provider. This is important, especially if your fibroids need to be monitored for any changes. Contact a health care provider if you:  Have pelvic pain, back pain, or cramps in your abdomen that do not get better with medicine or heat.  Develop new bleeding between periods.  Have increased bleeding during or between periods.  Feel unusually tired or weak.  Feel  light-headed. Get help right away if you:  Faint.  Have pelvic pain that suddenly gets worse.  Have severe vaginal bleeding that soaks a tampon or pad in 30 minutes or less. Summary  Uterine fibroids are noncancerous (benign) tumors that can develop in the uterus.  The exact cause of this condition is not known.  Most fibroids do not require medical treatment unless they affect your ability to have children (fertility).  Contact a health care provider if you have pelvic pain, back pain, or cramps in your abdomen that do not get better with medicines.  Make sure you know what symptoms should cause you to get help right away. This information is not intended to replace advice given to you by your health care provider. Make sure you discuss any questions you have with your health care provider. Document Released: 05/15/2000 Document Revised: 04/13/2017 Document Reviewed: 04/13/2017 Elsevier Interactive Patient Education  2019 [employer].

## 2018-06-20 NOTE — Progress Notes (Signed)
Ultrasound and biopsy results reviewed. Biopsy inadequate for assessing endometrium. Recommend further biopsy or intervention.  Hysteroscopy D&C would diagnose (and treat) condition. Pt counseled as to these rationale.  Reassured.  Plan surgery 07/07/2018. Barnett Applebaum, MD, [facility], [facility] 06/20/2018  4:37 PM

## 2018-06-20 NOTE — Progress Notes (Signed)
Surgery Booking Request Patient Full Name:  Hailey Sheppard  MRN: [id number]  DOB: January 22, 1966  Surgeon: Hoyt Koch, MD  Requested Surgery Date and Time: 07/07/2018 Primary Diagnosis AND Code: uterine mass, menorrhagia Secondary Diagnosis and Code:  Surgical Procedure: Hysteroscopy D&C L&D Notification: No Admission Status: same day surgery Length of Surgery: 30 min Special Case Needs: TruClear H&P: yes (date) Phone Interview???: yes Interpreter: Language:  Medical Clearance: no Special Scheduling Instructions: no

## 2018-06-21 ENCOUNTER — Telehealth: Payer: Self-pay | Admitting: Obstetrics & Gynecology

## 2018-06-21 NOTE — Telephone Encounter (Signed)
-----   Message from Gae Dry, MD sent at 06/20/2018  4:36 PM EST ----- Surgery Booking Request Patient Full Name:  Hailey Sheppard  MRN: [id number]  DOB: 07/14/1965  Surgeon: Hoyt Koch, MD  Requested Surgery Date and Time: 07/07/2018 Primary Diagnosis AND Code: uterine mass, menorrhagia Secondary Diagnosis and Code:  Surgical Procedure: Hysteroscopy D&C L&D Notification: No Admission Status: same day surgery Length of Surgery: 30 min Special Case Needs: TruClear H&P: yes (date) Phone Interview???: yes Interpreter: Language:  Medical Clearance: no Special Scheduling Instructions: no

## 2018-06-21 NOTE — Telephone Encounter (Signed)
Lmtrc

## 2018-06-22 LAB — CYTOLOGY - PAP: Diagnosis: NEGATIVE

## 2018-06-22 NOTE — Telephone Encounter (Signed)
Patient is aware of H&P at [facility] on 07/01/18 @ 11:20am w/ Dr Kenton Kingfisher, Pre-admit Testing phone interview to be scheduled, and OR on 07/07/18. Patient is aware she may receive calls from the [place] and [facility]. Patient confirmed [employer] and no secondary insurance. Patient was given the phone# to Same Day Surgery to call on 07/06/18 1-3pm for an arrival time. Patient is aware someone will need to drive her on day of surgery and said husband will be there.

## 2018-06-23 ENCOUNTER — Telehealth: Payer: Self-pay | Admitting: Obstetrics and Gynecology

## 2018-06-23 ENCOUNTER — Encounter: Payer: Self-pay | Admitting: Obstetrics and Gynecology

## 2018-06-23 NOTE — Telephone Encounter (Signed)
Called pt to offer [facility] cancer genetic testing given FH of ovar cancer in her MGM and upcoming GYN surg 07/07/18. Discussed with Dr. Kenton Kingfisher, as well. LM for pt to call me back if interested or wants more details.

## 2018-06-24 ENCOUNTER — Telehealth: Payer: Self-pay | Admitting: Obstetrics and Gynecology

## 2018-06-24 NOTE — Telephone Encounter (Signed)
Patient is interested in myriad. Will schedule patient.

## 2018-06-27 ENCOUNTER — Ambulatory Visit: Payer: 59

## 2018-06-27 ENCOUNTER — Encounter: Payer: Self-pay | Admitting: Obstetrics & Gynecology

## 2018-06-27 DIAGNOSIS — Z8041 Family history of malignant neoplasm of ovary: Secondary | ICD-10-CM | POA: Diagnosis not present

## 2018-06-27 NOTE — Telephone Encounter (Signed)
My[employer] testing done today. Will call with results. Trying to get ASAP for 07/07/18 surgery.

## 2018-07-01 ENCOUNTER — Ambulatory Visit (INDEPENDENT_AMBULATORY_CARE_PROVIDER_SITE_OTHER): Payer: 59 | Admitting: Obstetrics & Gynecology

## 2018-07-01 ENCOUNTER — Other Ambulatory Visit: Payer: Self-pay

## 2018-07-01 ENCOUNTER — Encounter: Payer: Self-pay | Admitting: Obstetrics & Gynecology

## 2018-07-01 VITALS — BP 120/80 | HR 98 | Ht 62.0 in | Wt 182.0 lb

## 2018-07-01 DIAGNOSIS — N858 Other specified noninflammatory disorders of uterus: Secondary | ICD-10-CM

## 2018-07-01 DIAGNOSIS — N926 Irregular menstruation, unspecified: Secondary | ICD-10-CM

## 2018-07-01 NOTE — Progress Notes (Signed)
PRE-OPERATIVE HISTORY AND PHYSICAL EXAM  HPI:  Hailey Sheppard is a 53 y.o. G0P0000 No LMP recorded. Patient is premenopausal.; she is being admitted for surgery related to abnormal uterine bleeding.  She is NOT menopausal as she continues w periods every 2-3 months, although they are light and for just a few days.  However, recently in Oct-Nov 2019 she had 6 weeks of prolonged bleeding.   She does have hot flashes and night sweats.  She has intermittent LLQ pressure or mild pain, no radiation or assoc sx's or modifiers.  She has FH Ovarian cancer.  Ultrasound reveals a thickened area, possibly a 2 cm polyp or fibroid Unable to adequately perform EMB in office (stenosis or angle)  PMHx: Past Medical History:  Diagnosis Date  . Allergy   . Anxiety   . Complication of anesthesia    pt panics easily feeling like she cant breathe  . GERD (gastroesophageal reflux disease)   . Headache    occ-migraines  . Hypertension    PCP prescribed pt bp med but pt never took it   Past Surgical History:  Procedure Laterality Date  . APPENDECTOMY  1980   Open with Bowel Resection due to Perforation  . CHOLECYSTECTOMY N/A 06/17/2016   Procedure: LAPAROSCOPIC CHOLECYSTECTOMY;  Surgeon: Florene Glen, MD;  Location: [facility] ORS;  Service: General;  Laterality: N/A;  . COLON SURGERY  1980   Bowel Resection during Appendectomy secondary to Perforation  . GALLBLADDER SURGERY     januray 17th 2018   Family History  Problem Relation Age of Onset  . Diabetes Mother   . Obesity Mother   . Diabetes Father   . Gallbladder disease Brother   . Ovarian cancer Maternal Grandmother 32  . Heart disease Maternal Grandfather   . Gallbladder disease Maternal Grandfather   . Stroke Paternal Grandmother   . Kidney cancer Paternal Grandmother    Social History   Tobacco Use  . Smoking status: Never Smoker  . Smokeless tobacco: Never Used  Substance Use Topics  . Alcohol use: Yes    Comment: Occasional  .  Drug use: No    Current Outpatient Medications:  .  acetaminophen (TYLENOL) 500 MG tablet, Take 500 mg by mouth every 6 (six) hours as needed for mild pain., Disp: , Rfl:  .  cholecalciferol (VITAMIN D) 1000 units tablet, Take 1 tablet (1,000 Units total) by mouth daily., Disp: , Rfl:  .  esomeprazole (NEXIUM) 20 MG capsule, Take 20 mg by mouth at bedtime. , Disp: , Rfl:  .  ibuprofen (ADVIL,MOTRIN) 200 MG tablet, Take 200 mg by mouth every 6 (six) hours as needed for mild pain., Disp: , Rfl:  .  loratadine (CLARITIN) 10 MG tablet, Take 10 mg by mouth daily as needed for allergies., Disp: , Rfl:  .  Misc Natural Products (DIETERS HERBAL COMBINATION PO), Take 2 capsules by mouth 2 (two) times daily. [employer], Disp: , Rfl:  .  polyethylene glycol powder (GLYCOLAX/MIRALAX) powder, Take 17 g by mouth daily as needed. , Disp: , Rfl:  .  pseudoephedrine-acetaminophen (TYLENOL SINUS) 30-500 MG TABS tablet, Take 1 tablet by mouth every 4 (four) hours as needed., Disp: , Rfl:  .  tetrahydrozoline-zinc (VISINE-AC) 0.05-0.25 % ophthalmic solution, Place 2 drops into both eyes 3 (three) times daily as needed., Disp: , Rfl:  .  Triamcinolone Acetonide (NASACORT AQ NA), Place 1 spray into the nose daily., Disp: , Rfl:  Allergies: Erythromycin  Review of Systems  Constitutional: Negative for chills, fever and malaise/fatigue.  HENT: Negative for congestion, sinus pain and sore throat.   Eyes: Negative for blurred vision and pain.  Respiratory: Negative for cough and wheezing.   Cardiovascular: Negative for chest pain and leg swelling.  Gastrointestinal: Negative for abdominal pain, constipation, diarrhea, heartburn, nausea and vomiting.  Genitourinary: Negative for dysuria, frequency, hematuria and urgency.  Musculoskeletal: Negative for back pain, joint pain, myalgias and neck pain.  Skin: Negative for itching and rash.  Neurological: Negative for dizziness, tremors and weakness.    Endo/Heme/Allergies: Does not bruise/bleed easily.  Psychiatric/Behavioral: Negative for depression. The patient is not nervous/anxious and does not have insomnia.     Objective: BP 120/80 (BP Location: Left Arm, Patient Position: Sitting, Cuff Size: Normal)   Pulse 98   Ht 5\' 2"  (1.575 m)   Wt 182 lb (82.6 kg)   BMI 33.29 kg/m   Filed Weights   07/01/18 1124  Weight: 182 lb (82.6 kg)   Physical Exam Constitutional:      General: She is not in acute distress.    Appearance: She is well-developed.  HENT:     Head: Normocephalic and atraumatic. No laceration.     Right Ear: Hearing normal.     Left Ear: Hearing normal.     Mouth/Throat:     Pharynx: Uvula midline.  Eyes:     Pupils: Pupils are equal, round, and reactive to light.  Neck:     Musculoskeletal: Normal range of motion and neck supple.     Thyroid: No thyromegaly.  Cardiovascular:     Rate and Rhythm: Normal rate and regular rhythm.     Heart sounds: No murmur. No friction rub. No gallop.   Pulmonary:     Effort: Pulmonary effort is normal. No respiratory distress.     Breath sounds: Normal breath sounds. No wheezing.  Chest:     Breasts:        Right: No mass, skin change or tenderness.        Left: No mass, skin change or tenderness.  Abdominal:     General: Bowel sounds are normal. There is no distension.     Palpations: Abdomen is soft.     Tenderness: There is no abdominal tenderness. There is no rebound.  Musculoskeletal: Normal range of motion.  Neurological:     Mental Status: She is alert and oriented to person, place, and time.     Cranial Nerves: No cranial nerve deficit.  Skin:    General: Skin is warm and dry.  Psychiatric:        Judgment: Judgment normal.  Vitals signs reviewed.   Assessment: 1. Uterine mass   2. Irregular menses   Plan Hysteroscopy D&C and excision of polyp or fibroid  I have had a careful discussion with this patient about all the options available and the  risk/benefits of each. I have fully informed this patient that surgery may subject her to a variety of discomforts and risks: She understands that most patients have surgery with little difficulty, but problems can happen ranging from minor to fatal. These include nausea, vomiting, pain, bleeding, infection, poor healing, hernia, or formation of adhesions. Unexpected reactions may occur from any drug or anesthetic given. Unintended injury may occur to other pelvic or abdominal structures such as Fallopian tubes, ovaries, bladder, ureter (tube from kidney to bladder), or bowel. Nerves going from the pelvis to the legs may be injured. Any such injury  may require immediate or later additional surgery to correct the problem. Excessive blood loss requiring transfusion is very unlikely but possible. Dangerous blood clots may form in the legs or lungs. Physical and sexual activity will be restricted in varying degrees for an indeterminate period of time but most often 2-6 weeks.  Finally, she understands that it is impossible to list every possible undesirable effect and that the condition for which surgery is done is not always cured or significantly improved, and in rare cases may be even worse.Ample time was given to answer all questions.  Barnett Applebaum, MD, [facility], [facility] 07/01/2018  11:47 AM

## 2018-07-01 NOTE — H&P (View-Only) (Signed)
PRE-OPERATIVE HISTORY AND PHYSICAL EXAM  HPI:  Hailey Sheppard is a 53 y.o. G0P0000 No LMP recorded. Patient is premenopausal.; she is being admitted for surgery related to abnormal uterine bleeding.  She is NOT menopausal as she continues w periods every 2-3 months, although they are light and for just a few days.  However, recently in Oct-Nov 2019 she had 6 weeks of prolonged bleeding.   She does have hot flashes and night sweats.  She has intermittent LLQ pressure or mild pain, no radiation or assoc sx's or modifiers.  She has FH Ovarian cancer.  Ultrasound reveals a thickened area, possibly a 2 cm polyp or fibroid Unable to adequately perform EMB in office (stenosis or angle)  PMHx: Past Medical History:  Diagnosis Date  . Allergy   . Anxiety   . Complication of anesthesia    pt panics easily feeling like she cant breathe  . GERD (gastroesophageal reflux disease)   . Headache    occ-migraines  . Hypertension    PCP prescribed pt bp med but pt never took it   Past Surgical History:  Procedure Laterality Date  . APPENDECTOMY  1980   Open with Bowel Resection due to Perforation  . CHOLECYSTECTOMY N/A 06/17/2016   Procedure: LAPAROSCOPIC CHOLECYSTECTOMY;  Surgeon: Florene Glen, MD;  Location: [facility] ORS;  Service: General;  Laterality: N/A;  . COLON SURGERY  1980   Bowel Resection during Appendectomy secondary to Perforation  . GALLBLADDER SURGERY     januray 17th 2018   Family History  Problem Relation Age of Onset  . Diabetes Mother   . Obesity Mother   . Diabetes Father   . Gallbladder disease Brother   . Ovarian cancer Maternal Grandmother 65  . Heart disease Maternal Grandfather   . Gallbladder disease Maternal Grandfather   . Stroke Paternal Grandmother   . Kidney cancer Paternal Grandmother    Social History   Tobacco Use  . Smoking status: Never Smoker  . Smokeless tobacco: Never Used  Substance Use Topics  . Alcohol use: Yes    Comment: Occasional  .  Drug use: No    Current Outpatient Medications:  .  acetaminophen (TYLENOL) 500 MG tablet, Take 500 mg by mouth every 6 (six) hours as needed for mild pain., Disp: , Rfl:  .  cholecalciferol (VITAMIN D) 1000 units tablet, Take 1 tablet (1,000 Units total) by mouth daily., Disp: , Rfl:  .  esomeprazole (NEXIUM) 20 MG capsule, Take 20 mg by mouth at bedtime. , Disp: , Rfl:  .  ibuprofen (ADVIL,MOTRIN) 200 MG tablet, Take 200 mg by mouth every 6 (six) hours as needed for mild pain., Disp: , Rfl:  .  loratadine (CLARITIN) 10 MG tablet, Take 10 mg by mouth daily as needed for allergies., Disp: , Rfl:  .  Misc Natural Products (DIETERS HERBAL COMBINATION PO), Take 2 capsules by mouth 2 (two) times daily. [employer], Disp: , Rfl:  .  polyethylene glycol powder (GLYCOLAX/MIRALAX) powder, Take 17 g by mouth daily as needed. , Disp: , Rfl:  .  pseudoephedrine-acetaminophen (TYLENOL SINUS) 30-500 MG TABS tablet, Take 1 tablet by mouth every 4 (four) hours as needed., Disp: , Rfl:  .  tetrahydrozoline-zinc (VISINE-AC) 0.05-0.25 % ophthalmic solution, Place 2 drops into both eyes 3 (three) times daily as needed., Disp: , Rfl:  .  Triamcinolone Acetonide (NASACORT AQ NA), Place 1 spray into the nose daily., Disp: , Rfl:  Allergies: Erythromycin  Review of Systems  Constitutional: Negative for chills, fever and malaise/fatigue.  HENT: Negative for congestion, sinus pain and sore throat.   Eyes: Negative for blurred vision and pain.  Respiratory: Negative for cough and wheezing.   Cardiovascular: Negative for chest pain and leg swelling.  Gastrointestinal: Negative for abdominal pain, constipation, diarrhea, heartburn, nausea and vomiting.  Genitourinary: Negative for dysuria, frequency, hematuria and urgency.  Musculoskeletal: Negative for back pain, joint pain, myalgias and neck pain.  Skin: Negative for itching and rash.  Neurological: Negative for dizziness, tremors and weakness.    Endo/Heme/Allergies: Does not bruise/bleed easily.  Psychiatric/Behavioral: Negative for depression. The patient is not nervous/anxious and does not have insomnia.     Objective: BP 120/80 (BP Location: Left Arm, Patient Position: Sitting, Cuff Size: Normal)   Pulse 98   Ht 5\' 2"  (1.575 m)   Wt 182 lb (82.6 kg)   BMI 33.29 kg/m   Filed Weights   07/01/18 1124  Weight: 182 lb (82.6 kg)   Physical Exam Constitutional:      General: She is not in acute distress.    Appearance: She is well-developed.  HENT:     Head: Normocephalic and atraumatic. No laceration.     Right Ear: Hearing normal.     Left Ear: Hearing normal.     Mouth/Throat:     Pharynx: Uvula midline.  Eyes:     Pupils: Pupils are equal, round, and reactive to light.  Neck:     Musculoskeletal: Normal range of motion and neck supple.     Thyroid: No thyromegaly.  Cardiovascular:     Rate and Rhythm: Normal rate and regular rhythm.     Heart sounds: No murmur. No friction rub. No gallop.   Pulmonary:     Effort: Pulmonary effort is normal. No respiratory distress.     Breath sounds: Normal breath sounds. No wheezing.  Chest:     Breasts:        Right: No mass, skin change or tenderness.        Left: No mass, skin change or tenderness.  Abdominal:     General: Bowel sounds are normal. There is no distension.     Palpations: Abdomen is soft.     Tenderness: There is no abdominal tenderness. There is no rebound.  Musculoskeletal: Normal range of motion.  Neurological:     Mental Status: She is alert and oriented to person, place, and time.     Cranial Nerves: No cranial nerve deficit.  Skin:    General: Skin is warm and dry.  Psychiatric:        Judgment: Judgment normal.  Vitals signs reviewed.   Assessment: 1. Uterine mass   2. Irregular menses   Plan Hysteroscopy D&C and excision of polyp or fibroid  I have had a careful discussion with this patient about all the options available and the  risk/benefits of each. I have fully informed this patient that surgery may subject her to a variety of discomforts and risks: She understands that most patients have surgery with little difficulty, but problems can happen ranging from minor to fatal. These include nausea, vomiting, pain, bleeding, infection, poor healing, hernia, or formation of adhesions. Unexpected reactions may occur from any drug or anesthetic given. Unintended injury may occur to other pelvic or abdominal structures such as Fallopian tubes, ovaries, bladder, ureter (tube from kidney to bladder), or bowel. Nerves going from the pelvis to the legs may be injured. Any such injury  may require immediate or later additional surgery to correct the problem. Excessive blood loss requiring transfusion is very unlikely but possible. Dangerous blood clots may form in the legs or lungs. Physical and sexual activity will be restricted in varying degrees for an indeterminate period of time but most often 2-6 weeks.  Finally, she understands that it is impossible to list every possible undesirable effect and that the condition for which surgery is done is not always cured or significantly improved, and in rare cases may be even worse.Ample time was given to answer all questions.  Barnett Applebaum, MD, [facility], [facility] 07/01/2018  11:47 AM

## 2018-07-01 NOTE — Patient Instructions (Signed)
Hysteroscopy, Care After  This sheet gives you information about how to care for yourself after your procedure. Your health care provider may also give you more specific instructions. If you have problems or questions, contact your health care provider.  What can I expect after the procedure?  After the procedure, it is common to have:  · Cramping.  · Bleeding. This can vary from light spotting to menstrual-like bleeding.  Follow these instructions at home:  Activity  · Rest for 1-2 days after the procedure.  · Do not douche, use tampons, or have sex for 2 weeks after the procedure, or until your health care provider approves.  · Do not drive for 24 hours after the procedure, or for as long as told by your health care provider.  · Do not drive, use heavy machinery, or drink alcohol while taking prescription pain medicines.  Medicines    · Take over-the-counter and prescription medicines only as told by your health care provider.  · Do not take aspirin during recovery. It can increase the risk of bleeding.  General instructions  · Do not take baths, swim, or use a hot tub until your health care provider approves. Take showers instead of baths for 2 weeks, or for as long as told by your health care provider.  · To prevent or treat constipation while you are taking prescription pain medicine, your health care provider may recommend that you:  ? Drink enough fluid to keep your urine clear or pale yellow.  ? Take over-the-counter or prescription medicines.  ? Eat foods that are high in fiber, such as fresh fruits and vegetables, whole grains, and beans.  ? Limit foods that are high in fat and processed sugars, such as fried and sweet foods.  · Keep all follow-up visits as told by your health care provider. This is important.  Contact a health care provider if:  · You feel dizzy or lightheaded.  · You feel nauseous.  · You have abnormal vaginal discharge.  · You have a rash.  · You have pain that does not get better with  medicine.  · You have chills.  Get help right away if:  · You have bleeding that is heavier than a normal menstrual period.  · You have a fever.  · You have pain or cramps that get worse.  · You develop new abdominal pain.  · You faint.  · You have pain in your shoulders.  · You have shortness of breath.  Summary  · After the procedure, you may have cramping and some vaginal bleeding.  · Do not douche, use tampons, or have sex for 2 weeks after the procedure, or until your health care provider approves.  · Do not take baths, swim, or use a hot tub until your health care provider approves. Take showers instead of baths for 2 weeks, or for as long as told by your health care provider.  · Report any unusual symptoms to your health care provider.  · Keep all follow-up visits as told by your health care provider. This is important.  This information is not intended to replace advice given to you by your health care provider. Make sure you discuss any questions you have with your health care provider.  Document Released: 03/08/2013 Document Revised: 06/16/2016 Document Reviewed: 06/16/2016  Elsevier Interactive Patient Education © 2019 Elsevier Inc[employer]

## 2018-07-02 DIAGNOSIS — Z1509 Genetic susceptibility to other malignant neoplasm: Secondary | ICD-10-CM

## 2018-07-02 HISTORY — DX: Genetic susceptibility to other malignant neoplasm: Z15.09

## 2018-07-04 ENCOUNTER — Encounter
Admission: RE | Admit: 2018-07-04 | Discharge: 2018-07-04 | Disposition: A | Discharge status: Home / Self Care | Payer: 59 | Source: Ambulatory Visit | Attending: Obstetrics & Gynecology | Admitting: Obstetrics & Gynecology

## 2018-07-04 ENCOUNTER — Encounter: Payer: Self-pay | Admitting: *Deleted

## 2018-07-04 ENCOUNTER — Other Ambulatory Visit: Payer: Self-pay

## 2018-07-04 HISTORY — DX: Unspecified osteoarthritis, unspecified site: M19.90

## 2018-07-04 NOTE — Patient Instructions (Signed)
Your procedure is scheduled on: 07-07-18 THURSDAY Report to Same Day Surgery 2nd floor medical mall [facility] Entrance-take elevator on left to 2nd floor.  Check in with surgery information desk.) To find out your arrival time please call [phone] between 1PM - 3PM on 07-06-18 [facility]  Remember: Instructions that are not followed completely may result in serious medical risk, up to and including death, or upon the discretion of your surgeon and anesthesiologist your surgery may need to be rescheduled.    _x___ 1. Do not eat food after midnight the night before your procedure. NO GUM OR CANDY AFTER MIDNIGHT.  You may drink clear liquids up to 2 hours before you are scheduled to arrive at the hospital for your procedure.  Do not drink clear liquids within 2 hours of your scheduled arrival to the hospital.  Clear liquids include  --Water or Apple juice without pulp  --Clear carbohydrate beverage such as ClearFast or Gatorade  --Black Coffee or Clear Tea (No milk, no creamers, do not add anything to the coffee or Tea   ____Ensure clear carbohydrate drink on the way to the hospital for bariatric patients  ____Ensure clear carbohydrate drink 3 hours before surgery for Dr Dwyane Luo patients if physician instructed.     __x__ 2. No Alcohol for 24 hours before or after surgery.   __x__3. No Smoking or e-cigarettes for 24 prior to surgery.  Do not use any chewable tobacco products for at least 6 hour prior to surgery   ____  4. Bring all medications with you on the day of surgery if instructed.    __x__ 5. Notify your doctor if there is any change in your medical condition     (cold, fever, infections).    x___6. On the morning of surgery brush your teeth with toothpaste and water.  You may rinse your mouth with mouth wash if you wish.  Do not swallow any toothpaste or mouthwash.   Do not wear jewelry, make-up, hairpins, clips or nail polish.  Do not wear lotions, powders, or perfumes.  You may wear deodorant.  Do not shave 48 hours prior to surgery. Men may shave face and neck.  Do not bring valuables to the hospital.    [facility] is not responsible for any belongings or valuables.               Contacts, dentures or bridgework may not be worn into surgery.  Leave your suitcase in the car. After surgery it may be brought to your room.  For patients admitted to the hospital, discharge time is determined by your  treatment team.  _  Patients discharged the day of surgery will not be allowed to drive home.  You will need someone to drive you home and stay with you the night of your procedure.    Please read over the following fact sheets that you were given:   [facility] Preparing for Surgery  _x___ TAKE THE FOLLOWING MEDICATION THE MORNING OF SURGERY WITH A SMALL SIP OF WATER. These include:  1. NEXIUM  2.  3.  4.  5.  6.  ____Fleets enema or Magnesium Citrate as directed.   ____ Use CHG Soap or sage wipes as directed on instruction sheet   ____ Use inhalers on the day of surgery and bring to hospital day of surgery  ____ Stop Metformin and Janumet 2 days prior to surgery.    ____ Take 1/2 of usual insulin dose the night before  surgery and none on the morning surgery.   ____ Follow recommendations from Cardiologist, Pulmonologist or PCP regarding stopping Aspirin, Coumadin, Plavix ,Eliquis, Effient, or Pradaxa, and Pletal.  X____Stop Anti-inflammatories such as Advil, Aleve, Ibuprofen, Motrin, Naproxen, Naprosyn, Goodies powders or aspirin products NOW- OK to take Tylenol    _x___ Stop supplements until after surgery-STOP DIETERS HERBAL COMBINATION NOW-MAY RESUME AFTER SURGERY   ____ Bring C-Pap to the hospital.

## 2018-07-06 ENCOUNTER — Encounter
Admission: RE | Admit: 2018-07-06 | Discharge: 2018-07-06 | Disposition: A | Discharge status: Home / Self Care | Payer: 59 | Source: Ambulatory Visit | Attending: Obstetrics & Gynecology | Admitting: Obstetrics & Gynecology

## 2018-07-06 DIAGNOSIS — Z791 Long term (current) use of non-steroidal anti-inflammatories (NSAID): Secondary | ICD-10-CM | POA: Diagnosis not present

## 2018-07-06 DIAGNOSIS — Z823 Family history of stroke: Secondary | ICD-10-CM | POA: Diagnosis not present

## 2018-07-06 DIAGNOSIS — D250 Submucous leiomyoma of uterus: Secondary | ICD-10-CM | POA: Diagnosis not present

## 2018-07-06 DIAGNOSIS — G43909 Migraine, unspecified, not intractable, without status migrainosus: Secondary | ICD-10-CM | POA: Diagnosis not present

## 2018-07-06 DIAGNOSIS — Z8489 Family history of other specified conditions: Secondary | ICD-10-CM | POA: Diagnosis not present

## 2018-07-06 DIAGNOSIS — Z79899 Other long term (current) drug therapy: Secondary | ICD-10-CM | POA: Diagnosis not present

## 2018-07-06 DIAGNOSIS — Z9049 Acquired absence of other specified parts of digestive tract: Secondary | ICD-10-CM | POA: Diagnosis not present

## 2018-07-06 DIAGNOSIS — Z01812 Encounter for preprocedural laboratory examination: Secondary | ICD-10-CM

## 2018-07-06 DIAGNOSIS — Z6833 Body mass index (BMI) 33.0-33.9, adult: Secondary | ICD-10-CM | POA: Diagnosis not present

## 2018-07-06 DIAGNOSIS — K219 Gastro-esophageal reflux disease without esophagitis: Secondary | ICD-10-CM | POA: Diagnosis not present

## 2018-07-06 DIAGNOSIS — E669 Obesity, unspecified: Secondary | ICD-10-CM | POA: Diagnosis not present

## 2018-07-06 DIAGNOSIS — F419 Anxiety disorder, unspecified: Secondary | ICD-10-CM | POA: Diagnosis not present

## 2018-07-06 DIAGNOSIS — N920 Excessive and frequent menstruation with regular cycle: Secondary | ICD-10-CM | POA: Diagnosis not present

## 2018-07-06 DIAGNOSIS — I10 Essential (primary) hypertension: Secondary | ICD-10-CM | POA: Diagnosis not present

## 2018-07-06 DIAGNOSIS — Z8041 Family history of malignant neoplasm of ovary: Secondary | ICD-10-CM | POA: Diagnosis not present

## 2018-07-06 DIAGNOSIS — Z833 Family history of diabetes mellitus: Secondary | ICD-10-CM | POA: Diagnosis not present

## 2018-07-06 DIAGNOSIS — Z8379 Family history of other diseases of the digestive system: Secondary | ICD-10-CM | POA: Diagnosis not present

## 2018-07-06 DIAGNOSIS — Z8051 Family history of malignant neoplasm of kidney: Secondary | ICD-10-CM | POA: Diagnosis not present

## 2018-07-06 DIAGNOSIS — M1990 Unspecified osteoarthritis, unspecified site: Secondary | ICD-10-CM | POA: Diagnosis not present

## 2018-07-06 DIAGNOSIS — Z881 Allergy status to other antibiotic agents status: Secondary | ICD-10-CM | POA: Diagnosis not present

## 2018-07-06 LAB — PROTIME-INR
INR: 0.94
Prothrombin Time: 12.5 seconds (ref 11.4–15.2)

## 2018-07-06 LAB — TYPE AND SCREEN
ABO/RH(D): O POS
ANTIBODY SCREEN: NEGATIVE

## 2018-07-06 LAB — CBC
HCT: 42 % (ref 36.0–46.0)
Hemoglobin: 13.6 g/dL (ref 12.0–15.0)
MCH: 29.9 pg (ref 26.0–34.0)
MCHC: 32.4 g/dL (ref 30.0–36.0)
MCV: 92.3 fL (ref 80.0–100.0)
Platelets: 214 10*3/uL (ref 150–400)
RBC: 4.55 MIL/uL (ref 3.87–5.11)
RDW: 13.4 % (ref 11.5–15.5)
WBC: 5.7 10*3/uL (ref 4.0–10.5)
nRBC: 0 % (ref 0.0–0.2)

## 2018-07-06 LAB — APTT: aPTT: 30 seconds (ref 24–36)

## 2018-07-07 ENCOUNTER — Ambulatory Visit
Admission: RE | Admit: 2018-07-07 | Discharge: 2018-07-07 | Disposition: A | Discharge status: Home / Self Care | Payer: 59 | Attending: Obstetrics & Gynecology | Admitting: Obstetrics & Gynecology

## 2018-07-07 ENCOUNTER — Encounter
Admission: RE | Disposition: A | Discharge status: Home / Self Care | Payer: Self-pay | Source: Home / Self Care | Attending: Obstetrics & Gynecology

## 2018-07-07 ENCOUNTER — Ambulatory Visit: Payer: 59 | Admitting: Anesthesiology

## 2018-07-07 ENCOUNTER — Other Ambulatory Visit: Payer: Self-pay

## 2018-07-07 DIAGNOSIS — N920 Excessive and frequent menstruation with regular cycle: Secondary | ICD-10-CM | POA: Insufficient documentation

## 2018-07-07 DIAGNOSIS — N840 Polyp of corpus uteri: Secondary | ICD-10-CM | POA: Diagnosis not present

## 2018-07-07 DIAGNOSIS — Z8051 Family history of malignant neoplasm of kidney: Secondary | ICD-10-CM | POA: Insufficient documentation

## 2018-07-07 DIAGNOSIS — M1990 Unspecified osteoarthritis, unspecified site: Secondary | ICD-10-CM | POA: Insufficient documentation

## 2018-07-07 DIAGNOSIS — G43909 Migraine, unspecified, not intractable, without status migrainosus: Secondary | ICD-10-CM | POA: Insufficient documentation

## 2018-07-07 DIAGNOSIS — Z6833 Body mass index (BMI) 33.0-33.9, adult: Secondary | ICD-10-CM | POA: Insufficient documentation

## 2018-07-07 DIAGNOSIS — D250 Submucous leiomyoma of uterus: Secondary | ICD-10-CM | POA: Diagnosis not present

## 2018-07-07 DIAGNOSIS — N858 Other specified noninflammatory disorders of uterus: Secondary | ICD-10-CM | POA: Diagnosis not present

## 2018-07-07 DIAGNOSIS — Z79899 Other long term (current) drug therapy: Secondary | ICD-10-CM | POA: Insufficient documentation

## 2018-07-07 DIAGNOSIS — I10 Essential (primary) hypertension: Secondary | ICD-10-CM | POA: Insufficient documentation

## 2018-07-07 DIAGNOSIS — Z7689 Persons encountering health services in other specified circumstances: Secondary | ICD-10-CM | POA: Diagnosis not present

## 2018-07-07 DIAGNOSIS — N946 Dysmenorrhea, unspecified: Secondary | ICD-10-CM | POA: Diagnosis present

## 2018-07-07 DIAGNOSIS — Z9049 Acquired absence of other specified parts of digestive tract: Secondary | ICD-10-CM | POA: Insufficient documentation

## 2018-07-07 DIAGNOSIS — Z8489 Family history of other specified conditions: Secondary | ICD-10-CM | POA: Insufficient documentation

## 2018-07-07 DIAGNOSIS — Z823 Family history of stroke: Secondary | ICD-10-CM | POA: Insufficient documentation

## 2018-07-07 DIAGNOSIS — Z881 Allergy status to other antibiotic agents status: Secondary | ICD-10-CM | POA: Insufficient documentation

## 2018-07-07 DIAGNOSIS — K219 Gastro-esophageal reflux disease without esophagitis: Secondary | ICD-10-CM | POA: Insufficient documentation

## 2018-07-07 DIAGNOSIS — Z8379 Family history of other diseases of the digestive system: Secondary | ICD-10-CM | POA: Insufficient documentation

## 2018-07-07 DIAGNOSIS — Z791 Long term (current) use of non-steroidal anti-inflammatories (NSAID): Secondary | ICD-10-CM | POA: Insufficient documentation

## 2018-07-07 DIAGNOSIS — Z8041 Family history of malignant neoplasm of ovary: Secondary | ICD-10-CM | POA: Insufficient documentation

## 2018-07-07 DIAGNOSIS — F419 Anxiety disorder, unspecified: Secondary | ICD-10-CM | POA: Insufficient documentation

## 2018-07-07 DIAGNOSIS — E669 Obesity, unspecified: Secondary | ICD-10-CM | POA: Insufficient documentation

## 2018-07-07 DIAGNOSIS — Z833 Family history of diabetes mellitus: Secondary | ICD-10-CM | POA: Insufficient documentation

## 2018-07-07 HISTORY — PX: DILATATION & CURETTAGE/HYSTEROSCOPY WITH TRUECLEAR: SHX6353

## 2018-07-07 LAB — GLUCOSE, CAPILLARY: GLUCOSE-CAPILLARY: 65 mg/dL — AB (ref 70–99)

## 2018-07-07 LAB — POCT PREGNANCY, URINE: Preg Test, Ur: NEGATIVE

## 2018-07-07 LAB — ABO/RH: ABO/RH(D): O POS

## 2018-07-07 SURGERY — DILATATION & CURETTAGE/HYSTEROSCOPY WITH TRUCLEAR
Anesthesia: General

## 2018-07-07 MED ORDER — LIDOCAINE HCL (PF) 2 % IJ SOLN
INTRAMUSCULAR | Status: AC
  Filled 2018-07-07: qty 10

## 2018-07-07 MED ORDER — LACTATED RINGERS IV SOLN: INTRAVENOUS | Status: DC

## 2018-07-07 MED ORDER — OXYCODONE-ACETAMINOPHEN 5-325 MG PO TABS: 1.0000 | ORAL_TABLET | ORAL | 0 refills | Status: DC | PRN

## 2018-07-07 MED ORDER — LIDOCAINE HCL (CARDIAC) PF 100 MG/5ML IV SOSY
PREFILLED_SYRINGE | INTRAVENOUS | Status: DC | PRN
  Administered 2018-07-07: 100 mg via INTRAVENOUS

## 2018-07-07 MED ORDER — OXYCODONE HCL 5 MG/5ML PO SOLN: 5.0000 mg | Freq: Once | ORAL | Status: DC | PRN

## 2018-07-07 MED ORDER — LACTATED RINGERS IV SOLN
INTRAVENOUS | Status: DC
  Administered 2018-07-07: 12:00:00 via INTRAVENOUS

## 2018-07-07 MED ORDER — FENTANYL CITRATE (PF) 100 MCG/2ML IJ SOLN
INTRAMUSCULAR | Status: AC
  Filled 2018-07-07: qty 2

## 2018-07-07 MED ORDER — ACETAMINOPHEN 325 MG PO TABS: 650.0000 mg | ORAL_TABLET | ORAL | Status: DC | PRN

## 2018-07-07 MED ORDER — FENTANYL CITRATE (PF) 100 MCG/2ML IJ SOLN: 25.0000 ug | INTRAMUSCULAR | Status: DC | PRN

## 2018-07-07 MED ORDER — DEXAMETHASONE SODIUM PHOSPHATE 10 MG/ML IJ SOLN
INTRAMUSCULAR | Status: DC | PRN
  Administered 2018-07-07: 10 mg via INTRAVENOUS

## 2018-07-07 MED ORDER — ONDANSETRON HCL 4 MG/2ML IJ SOLN
INTRAMUSCULAR | Status: AC
  Filled 2018-07-07: qty 2

## 2018-07-07 MED ORDER — ACETAMINOPHEN 650 MG RE SUPP
650.0000 mg | RECTAL | Status: DC | PRN
  Filled 2018-07-07: qty 1

## 2018-07-07 MED ORDER — MORPHINE SULFATE (PF) 4 MG/ML IV SOLN: 1.0000 mg | INTRAVENOUS | Status: DC | PRN

## 2018-07-07 MED ORDER — OXYCODONE HCL 5 MG PO TABS: 5.0000 mg | ORAL_TABLET | Freq: Once | ORAL | Status: DC | PRN

## 2018-07-07 MED ORDER — MIDAZOLAM HCL 2 MG/2ML IJ SOLN
INTRAMUSCULAR | Status: DC | PRN
  Administered 2018-07-07: 2 mg via INTRAVENOUS

## 2018-07-07 MED ORDER — FENTANYL CITRATE (PF) 100 MCG/2ML IJ SOLN
INTRAMUSCULAR | Status: DC | PRN
  Administered 2018-07-07 (×2): 50 ug via INTRAVENOUS

## 2018-07-07 MED ORDER — OXYCODONE-ACETAMINOPHEN 5-325 MG PO TABS: 1.0000 | ORAL_TABLET | ORAL | Status: DC | PRN

## 2018-07-07 MED ORDER — PROPOFOL 10 MG/ML IV BOLUS
INTRAVENOUS | Status: AC
  Filled 2018-07-07: qty 20

## 2018-07-07 MED ORDER — MIDAZOLAM HCL 2 MG/2ML IJ SOLN
INTRAMUSCULAR | Status: AC
  Filled 2018-07-07: qty 2

## 2018-07-07 MED ORDER — KETOROLAC TROMETHAMINE 30 MG/ML IJ SOLN
30.0000 mg | Freq: Four times a day (QID) | INTRAMUSCULAR | Status: DC
  Filled 2018-07-07: qty 1

## 2018-07-07 MED ORDER — MEPERIDINE HCL 50 MG/ML IJ SOLN: 6.2500 mg | INTRAMUSCULAR | Status: DC | PRN

## 2018-07-07 MED ORDER — PROMETHAZINE HCL 25 MG/ML IJ SOLN: 6.2500 mg | INTRAMUSCULAR | Status: DC | PRN

## 2018-07-07 MED ORDER — DEXAMETHASONE SODIUM PHOSPHATE 10 MG/ML IJ SOLN
INTRAMUSCULAR | Status: AC
  Filled 2018-07-07: qty 1

## 2018-07-07 MED ORDER — ONDANSETRON HCL 4 MG/2ML IJ SOLN
INTRAMUSCULAR | Status: DC | PRN
  Administered 2018-07-07: 4 mg via INTRAVENOUS

## 2018-07-07 MED ORDER — PROPOFOL 10 MG/ML IV BOLUS
INTRAVENOUS | Status: DC | PRN
  Administered 2018-07-07: 160 mg via INTRAVENOUS

## 2018-07-07 SURGICAL SUPPLY — 22 items
CANISTER SUCT 3000ML PPV (MISCELLANEOUS) ×2 IMPLANT
CATH ROBINSON RED A/P 16FR (CATHETERS) ×2 IMPLANT
CORD URO TURP 10FT (MISCELLANEOUS) ×2 IMPLANT
COVER WAND RF STERILE (DRAPES) ×2 IMPLANT
GLOVE BIO SURGEON STRL SZ8 (GLOVE) ×2 IMPLANT
GLYCINE 1.5% IRRIG UROMATIC (IV SOLUTION) IMPLANT
GOWN STRL REUS W/ TWL LRG LVL3 (GOWN DISPOSABLE) ×1 IMPLANT
GOWN STRL REUS W/ TWL XL LVL3 (GOWN DISPOSABLE) ×1 IMPLANT
GOWN STRL REUS W/TWL LRG LVL3 (GOWN DISPOSABLE) ×1
GOWN STRL REUS W/TWL XL LVL3 (GOWN DISPOSABLE) ×1
KIT TUBING HYSTEROLUX TRUCLEAR (ABLATOR) ×2 IMPLANT
MORCELLATOR RECIP TRUCLEAR 4.0 (ABLATOR) ×2 IMPLANT
PACK DNC HYST (MISCELLANEOUS) ×2 IMPLANT
PAD OB MATERNITY 4.3X12.25 (PERSONAL CARE ITEMS) ×2 IMPLANT
PAD PREP 24X41 OB/GYN DISP (PERSONAL CARE ITEMS) ×2 IMPLANT
SEAL HYSTERSCOPE TRUCLEAR ABLA (ABLATOR) ×2 IMPLANT
SOL .9 NS 3000ML IRR  AL (IV SOLUTION) ×1
SOL .9 NS 3000ML IRR UROMATIC (IV SOLUTION) ×1 IMPLANT
STRAP SAFETY 5IN WIDE (MISCELLANEOUS) ×2 IMPLANT
TOWEL OR 17X26 4PK STRL BLUE (TOWEL DISPOSABLE) ×2 IMPLANT
TUBING CONNECTING 10 (TUBING) ×2 IMPLANT
covidien truclear dense tissue shaver plus ×2 IMPLANT

## 2018-07-07 NOTE — Anesthesia Procedure Notes (Signed)
Procedure Name: LMA Insertion Date/Time: 07/07/2018 11:44 AM Performed by: Aline Brochure, CRNA Pre-anesthesia Checklist: Patient identified, Emergency Drugs available, Suction available and Patient being monitored Patient Re-evaluated:Patient Re-evaluated prior to induction Oxygen Delivery Method: Circle system utilized Preoxygenation: Pre-oxygenation with 100% oxygen Induction Type: IV induction Ventilation: Mask ventilation without difficulty LMA: LMA inserted LMA Size: 3.5 Tube type: Oral Number of attempts: 1 Placement Confirmation: positive ETCO2 and breath sounds checked- equal and bilateral Tube secured with: Tape Dental Injury: Teeth and Oropharynx as per pre-operative assessment

## 2018-07-07 NOTE — Interval H&P Note (Signed)
History and Physical Interval Note:  07/07/2018 10:00 AM  Hailey Sheppard  has presented today for surgery, with the diagnosis of UTERINE MASS, MENORRHAGIA  The various methods of treatment have been discussed with the patient and family. After consideration of risks, benefits and other options for treatment, the patient has consented to  Procedure(s): [place] (N/A) as a surgical intervention .  The patient's history has been reviewed, patient examined, no change in status, stable for surgery.  I have reviewed the patient's chart and labs.  Questions were answered to the patient's satisfaction.     Hoyt Koch

## 2018-07-07 NOTE — Op Note (Signed)
Operative Note  07/07/2018  PRE-OP DIAGNOSIS: Menorrhagia, Fibroid  POST-OP DIAGNOSIS: same   SURGEON: Barnett Applebaum, MD, FACOG  PROCEDURE: Procedure(s): DILATATION & CURETTAGE/HYSTEROSCOPY WITH TRUCLEAR   ANESTHESIA: Choice   ESTIMATED BLOOD LOSS: Min   SPECIMENS:  ECC, Endometrial mass  FLUID DEFICIT: Min  COMPLICATIONS: None  DISPOSITION: PACU - hemodynamically stable.  CONDITION: stable  FINDINGS: Exam under anesthesia revealed small, mobile  uterus with no masses and bilateral adnexa without masses or fullness. Hysteroscopy revealed a anterior based submucosal fibroid, otherwise normal uterine cavity with bilateral tubal ostia and normal appearing endocervical canal.  PROCEDURE IN DETAIL: After informed consent was obtained, the patient was taken to the operating room where anesthesia was obtained without difficulty. The patient was positioned in the dorsal lithotomy position in [place]. The patient's bladder was catheterized with an in and out foley catheter. The patient was examined under anesthesia, with the above noted findings. The weightedspeculum was placed inside the patient's vagina, and the the anterior lip of the cervix was seen and grasped with the tenaculum.  An Endocervical specimen was obtained with a kevorkian curette. The uterine cavity was sounded to 7cm, and then the cervix was progressively dilated to a 21French-Pratt dilator. The 30 degree hysteroscope was introduced, with saline fluid used to distend the intrauterine cavity, with the above noted findings.  The Truclear device is then utilized to extract the submucosal fibroid within the uterine cavity without difficulty and with minimal bleeding. Once complete, all instruments were removed, with excellent hemostasis noted throughout. She was then taken out of dorsal lithotomy. Minimal discrepancy in fluid was noted.  The patient tolerated the procedure well. Sponge, lap and needle counts were correct x2.  The patient was taken to recovery room in excellent condition.  Barnett Applebaum, MD, [facility], [facility] 07/07/2018  12:20 PM

## 2018-07-07 NOTE — Brief Op Note (Signed)
07/07/2018  12:24 PM  PATIENT:  Hailey Sheppard  53 y.o. female  PRE-OPERATIVE DIAGNOSIS:  UTERINE MASS, MENORRHAGIA  POST-OPERATIVE DIAGNOSIS:  UTERINE MASS, MENORRHAGIA  PROCEDURE:  Procedure(s): DILATATION & CURETTAGE/HYSTEROSCOPY WITH TRUCLEAR (N/A)  SURGEON:  Surgeon(s) and Role:    * Gae Dry, MD - Primary  ASSISTANTS: none   ANESTHESIA:   general  EBL: Min  SPECIMEN:  Source of Specimen:  ECC, Endometrial mass  PATIENT DISPOSITION:  PACU - hemodynamically stable.   Barnett Applebaum, MD, [facility], [facility] 07/07/2018  1:36 PM

## 2018-07-07 NOTE — Discharge Instructions (Signed)
Hysteroscopy, Care After This sheet gives you information about how to care for yourself after your procedure. Your health care provider may also give you more specific instructions. If you have problems or questions, contact your health care provider. What can I expect after the procedure? After the procedure, it is common to have:  Cramping.  Bleeding. This can vary from light spotting to menstrual-like bleeding. Follow these instructions at home: Activity  Rest for 1-2 days after the procedure.  Do not douche, use tampons, or have sex for 2 weeks after the procedure, or until your health care provider approves.  Do not drive for 24 hours after the procedure, or for as long as told by your health care provider.  Do not drive, use heavy machinery, or drink alcohol while taking prescription pain medicines. Medicines   Take over-the-counter and prescription medicines only as told by your health care provider.  Do not take aspirin during recovery. It can increase the risk of bleeding. General instructions  Do not take baths, swim, or use a hot tub until your health care provider approves. Take showers instead of baths for 2 weeks, or for as long as told by your health care provider.  To prevent or treat constipation while you are taking prescription pain medicine, your health care provider may recommend that you: ? Drink enough fluid to keep your urine clear or pale yellow. ? Take over-the-counter or prescription medicines. ? Eat foods that are high in fiber, such as fresh fruits and vegetables, whole grains, and beans. ? Limit foods that are high in fat and processed sugars, such as fried and sweet foods.  Keep all follow-up visits as told by your health care provider. This is important. Contact a health care provider if:  You feel dizzy or lightheaded.  You feel nauseous.  You have abnormal vaginal discharge.  You have a rash.  You have pain that does not get better with  medicine.  You have chills. Get help right away if:  You have bleeding that is heavier than a normal menstrual period.  You have a fever.  You have pain or cramps that get worse.  You develop new abdominal pain.  You faint.  You have pain in your shoulders.  You have shortness of breath. Summary  After the procedure, you may have cramping and some vaginal bleeding.  Do not douche, use tampons, or have sex for 2 weeks after the procedure, or until your health care provider approves.  Do not take baths, swim, or use a hot tub until your health care provider approves. Take showers instead of baths for 2 weeks, or for as long as told by your health care provider.  Report any unusual symptoms to your health care provider.  Keep all follow-up visits as told by your health care provider. This is important. This information is not intended to replace advice given to you by your health care provider. Make sure you discuss any questions you have with your health care provider. Document Released: 03/08/2013 Document Revised: 06/16/2016 Document Reviewed: 06/16/2016 Elsevier Interactive Patient Education  2019 [place]   1) The drugs that you were given will stay in your system until tomorrow so for the next 24 hours you should not:  A) Drive an automobile B) Make any legal decisions C) Drink any alcoholic beverage   2) You may resume regular meals tomorrow.  Today it is better to start with liquids and gradually  work up to [employer].  You may eat anything you prefer, but it is better to start with liquids, then soup and crackers, and gradually work up to solid foods.   3) Please notify your doctor immediately if you have any unusual bleeding, trouble breathing, redness and pain at the surgery site, drainage, fever, or pain not relieved by medication.    4) Additional Instructions:    Please contact your physician with  any problems or Same Day Surgery at [phone], Monday through Friday 6 am to 4 pm, or [place] at [facility] number at [phone].  #5 Have MD paged

## 2018-07-07 NOTE — Anesthesia Preprocedure Evaluation (Addendum)
Anesthesia Evaluation  Patient identified by MRN, date of birth, ID band Patient awake    Reviewed: Allergy & Precautions, NPO status , Patient's Chart, lab work & pertinent test results  History of Anesthesia Complications Negative for: history of anesthetic complications  Airway Mallampati: III  TM Distance: >3 FB Neck ROM: Full    Dental no notable dental hx.    Pulmonary neg pulmonary ROS, neg sleep apnea, neg COPD,    breath sounds clear to auscultation- rhonchi (-) wheezing      Cardiovascular hypertension, (-) CAD, (-) Past MI, (-) Cardiac Stents and (-) CABG  Rhythm:Regular Rate:Normal - Systolic murmurs and - Diastolic murmurs    Neuro/Psych  Headaches, neg Seizures Anxiety    GI/Hepatic Neg liver ROS, GERD  ,  Endo/Other  negative endocrine ROSneg diabetes  Renal/GU negative Renal ROS     Musculoskeletal  (+) Arthritis ,   Abdominal (+) + obese,   Peds  Hematology negative hematology ROS (+)   Anesthesia Other Findings Past Medical History: No date: Allergy No date: Anxiety No date: Arthritis     Comment:  bil knees No date: Complication of anesthesia     Comment:  pt panics easily feeling like she cant breathe No date: GERD (gastroesophageal reflux disease) No date: Headache     Comment:  occ-migraines No date: Hypertension     Comment:  PCP prescribed pt bp med but pt never took it   Reproductive/Obstetrics                             Anesthesia Physical Anesthesia Plan  ASA: III  Anesthesia Plan: General   Post-op Pain Management:    Induction: Intravenous  PONV Risk Score and Plan: 2 and Ondansetron, Dexamethasone and Midazolam  Airway Management Planned: LMA  Additional Equipment:   Intra-op Plan:   Post-operative Plan:   Informed Consent: I have reviewed the patients History and Physical, chart, labs and discussed the procedure including the risks,  benefits and alternatives for the proposed anesthesia with the patient or authorized representative who has indicated his/her understanding and acceptance.     Dental advisory given  Plan Discussed with: CRNA and Anesthesiologist  Anesthesia Plan Comments:        Anesthesia Quick Evaluation

## 2018-07-07 NOTE — Anesthesia Post-op Follow-up Note (Signed)
Anesthesia QCDR form completed.        

## 2018-07-07 NOTE — Anesthesia Postprocedure Evaluation (Signed)
Anesthesia Post Note  Patient: Hailey Sheppard  Procedure(s) Performed: DILATATION & CURETTAGE/HYSTEROSCOPY WITH TRUCLEAR (N/A )  Patient location during evaluation: PACU Anesthesia Type: General Level of consciousness: awake and alert and oriented Pain management: pain level controlled Vital Signs Assessment: post-procedure vital signs reviewed and stable Respiratory status: spontaneous breathing, nonlabored ventilation and respiratory function stable Cardiovascular status: blood pressure returned to baseline and stable Postop Assessment: no signs of nausea or vomiting Anesthetic complications: no     Last Vitals:  Vitals:   07/07/18 1255 07/07/18 1312  BP:  (!) 157/89  Pulse: (!) 59 63  Resp: 14 16  Temp:  36.6 C  SpO2: 100% 100%    Last Pain:  Vitals:   07/07/18 1312  TempSrc: Oral  PainSc:                  [name]

## 2018-07-07 NOTE — Transfer of Care (Signed)
Immediate Anesthesia Transfer of Care Note  Patient: Hailey Sheppard  Procedure(s) Performed: DILATATION & CURETTAGE/HYSTEROSCOPY WITH TRUCLEAR (N/A )  Patient Location: PACU  Anesthesia Type:General  Level of Consciousness: awake, alert  and oriented  Airway & Oxygen Therapy: Patient connected to face mask oxygen  Post-op Assessment: Post -op Vital signs reviewed and stable  Post vital signs: stable  Last Vitals:  Vitals Value Taken Time  BP 135/93 07/07/2018 12:25 PM  Temp 36.1 C 07/07/2018 12:25 PM  Pulse 83 07/07/2018 12:26 PM  Resp 27 07/07/2018 12:26 PM  SpO2 100 % 07/07/2018 12:26 PM  Vitals shown include unvalidated device data.  Last Pain:  Vitals:   07/07/18 0942  TempSrc: Temporal  PainSc: 0-No pain         Complications: No apparent anesthesia complications

## 2018-07-08 ENCOUNTER — Encounter: Payer: Self-pay | Admitting: Obstetrics & Gynecology

## 2018-07-08 LAB — SURGICAL PATHOLOGY

## 2018-07-12 ENCOUNTER — Encounter: Payer: Self-pay | Admitting: Obstetrics & Gynecology

## 2018-07-15 ENCOUNTER — Ambulatory Visit (INDEPENDENT_AMBULATORY_CARE_PROVIDER_SITE_OTHER): Payer: 59 | Admitting: Obstetrics & Gynecology

## 2018-07-15 ENCOUNTER — Encounter: Payer: Self-pay | Admitting: Obstetrics & Gynecology

## 2018-07-15 VITALS — BP 120/80 | Ht 62.0 in | Wt 187.0 lb

## 2018-07-15 DIAGNOSIS — N858 Other specified noninflammatory disorders of uterus: Secondary | ICD-10-CM

## 2018-07-15 DIAGNOSIS — N926 Irregular menstruation, unspecified: Secondary | ICD-10-CM

## 2018-07-15 NOTE — Progress Notes (Signed)
  Postoperative Follow-up Patient presents post op from operative hysteroscopy for polyp and fibroid in uterus causing abnormal bleeding, 1 week ago. Images:    Pathology: A. ENDOCERVIX; CURETTAGE:  - ONE FRAGMENT SUGGESTIVE OF A POLYP, ENDOCERVICAL/ENDOMETRIAL TYPE,  ADMIXED WITH FRAGMENTS OF ENDOCERVICAL TISSUE AND SQUAMOUS EPITHELIUM.  - NEGATIVE FOR ATYPIA AND MALIGNANCY.   B. UTERINE CAVITY; HYSTEROSCOPIC MYOMECTOMY:  - SUBMUCOSAL LEIOMYOMA, MULTIPLE FRAGMENTS.  - PROLIFERATIVE ENDOMETRIUM.  - NEGATIVE FOR ATYPIA AND MALIGNANCY.  Subjective: Patient reports marked improvement in her preop symptoms. Eating a regular diet without difficulty. The patient is not having any pain.  Activity: normal activities of daily living. Patient reports additional symptom's since surgery of None.  Objective: BP 120/80   Ht 5\' 2"  (1.575 m)   Wt 187 lb (84.8 kg)   LMP 01/21/2018 (Exact Date)   BMI 34.20 kg/m  Physical Exam Constitutional:      General: She is not in acute distress.    Appearance: She is well-developed.  Musculoskeletal: Normal range of motion.  Neurological:     Mental Status: She is alert and oriented to person, place, and time.  Skin:    General: Skin is warm and dry.  Vitals signs reviewed.   Assessment: s/p :  operative hysteroscopy and myomectomy of submucus fibroid progressing well  Plan: Patient has done well after surgery with no apparent complications.  I have discussed the post-operative course to date, and the expected progress moving forward.  The patient understands what complications to be concerned about.  I will see the patient in routine follow up, or sooner if needed.    Activity plan: No restriction.  Hoyt Koch 07/15/2018, 10:05 AM

## 2018-07-26 ENCOUNTER — Encounter: Payer: Self-pay | Admitting: Obstetrics and Gynecology

## 2018-07-28 ENCOUNTER — Other Ambulatory Visit: Payer: Self-pay | Admitting: Obstetrics & Gynecology

## 2018-08-03 ENCOUNTER — Telehealth: Payer: Self-pay | Admitting: Obstetrics & Gynecology

## 2018-08-03 NOTE — Telephone Encounter (Signed)
Patient is schedule 08/03/18

## 2018-08-03 NOTE — Telephone Encounter (Signed)
-----   Message from Gae Dry, MD sent at 08/03/2018  7:32 AM EST ----- Regarding: sch appt Please sch appt to discuss lab results w patient.

## 2018-08-04 ENCOUNTER — Ambulatory Visit (INDEPENDENT_AMBULATORY_CARE_PROVIDER_SITE_OTHER): Payer: 59 | Admitting: Obstetrics & Gynecology

## 2018-08-04 ENCOUNTER — Encounter: Payer: Self-pay | Admitting: Obstetrics & Gynecology

## 2018-08-04 DIAGNOSIS — Z148 Genetic carrier of other disease: Secondary | ICD-10-CM | POA: Diagnosis not present

## 2018-08-04 NOTE — Progress Notes (Signed)
   Follow up discussion regarding recent genetic testing due to familial high risk factors.  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  Reassurance provided and counseling as to the pros and cons of testing was done today.  Since the current test is not perfect, it is possible that there may be a gene mutation that current testing cannot detect, but that chance is small. It is possible that a different genetic factor, which has not yet been discovered or is not on this panel, is responsible for the cancer diagnoses in the family. Again, the likelihood of this is low.   Most cancers happen by chance and this test, along with details of her family history, suggests that her cancer falls into this category. She is recommended to follow the cancer screening guidelines provided by her physician.  Breast cancer risk is further calculated based on the T-C model.  Even with negative gene testing, she still has a risk of 5 based on this calculation.  Recommendations are for yearly breast exams, mammograms, and MRI when this risk is > 20%.    Will plan GI referral, and needed colonoscopy soon.  She has never had one (she is 53 yo). Also counseled as to prophylactic TLH BSO for reducing risk for endometrial and ovarian cancer, as she is perimenopausal and done w child bearing.  To consider.  Genetic counselor information provided to pt and offered to arrange.  She prefers not to see one at this time.  A total of 25 minutes were spent face-to-face with the patient during this encounter and over half of that time dealt with counseling and coordination of care.  Barnett Applebaum, MD, [facility], [facility] 08/04/2018  9:19 AM

## 2018-08-05 ENCOUNTER — Telehealth: Payer: Self-pay

## 2018-08-05 ENCOUNTER — Telehealth: Payer: Self-pay | Admitting: Family Medicine

## 2018-08-05 ENCOUNTER — Other Ambulatory Visit: Payer: Self-pay

## 2018-08-05 DIAGNOSIS — Z148 Genetic carrier of other disease: Secondary | ICD-10-CM

## 2018-08-05 DIAGNOSIS — Z1211 Encounter for screening for malignant neoplasm of colon: Secondary | ICD-10-CM

## 2018-08-05 NOTE — Telephone Encounter (Signed)
See note from Dr. 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

## 2018-08-05 NOTE — Telephone Encounter (Signed)
Pt.notified

## 2018-08-05 NOTE — Telephone Encounter (Signed)
Gastroenterology Pre-Procedure Review  Request Date: 08/19/18 Requesting Physician: Dr. Vicente Males  PATIENT REVIEW QUESTIONS: The patient responded to the following health history questions as indicated:    1. Are you having any GI issues? yes (Reflux) 2. Do you have a personal history of Polyps? no 3. Do you have a family history of Colon Cancer or Polyps? no 4. Diabetes Mellitus? no 5. Joint replacements in the past 12 months?no 6. Major health problems in the past 3 months?D and C in February for Fibroids 7. Any artificial heart valves, MVP, or defibrillator?no    MEDICATIONS & ALLERGIES:    Patient reports the following regarding taking any anticoagulation/antiplatelet therapy:   Plavix, Coumadin, Eliquis, Xarelto, Lovenox, Pradaxa, Brilinta, or Effient? no Aspirin? no  Patient confirms/reports the following medications:  Current Outpatient Medications  Medication Sig Dispense Refill  . acetaminophen (TYLENOL) 500 MG tablet Take 500 mg by mouth every 6 (six) hours as needed for mild pain.    . cholecalciferol (VITAMIN D) 1000 units tablet Take 1 tablet (1,000 Units total) by mouth daily.    Marland Kitchen esomeprazole (NEXIUM) 20 MG capsule Take 20 mg by mouth at bedtime.     Marland Kitchen ibuprofen (ADVIL,MOTRIN) 200 MG tablet Take 200 mg by mouth every 6 (six) hours as needed for mild pain.    Marland Kitchen loratadine (CLARITIN) 10 MG tablet Take 10 mg by mouth daily as needed for allergies.    . Misc Natural Products (DIETERS HERBAL COMBINATION PO) Take 2 capsules by mouth 2 (two) times daily. [employer]    . oxyCODONE-acetaminophen (PERCOCET/ROXICET) 5-325 MG tablet Take 1 tablet by mouth every 4 (four) hours as needed for moderate pain. 15 tablet 0  . polyethylene glycol powder (GLYCOLAX/MIRALAX) powder Take 17 g by mouth daily as needed.     . pseudoephedrine-acetaminophen (TYLENOL SINUS) 30-500 MG TABS tablet Take 2-3 tablets by mouth daily.     Marland Kitchen tetrahydrozoline-zinc (VISINE-AC) 0.05-0.25 % ophthalmic solution  Place 2 drops into both eyes 3 (three) times daily as needed.    . Triamcinolone Acetonide (NASACORT AQ NA) Place 1 spray into the nose daily.     No current facility-administered medications for this visit.     Patient confirms/reports the following allergies:  Allergies  Allergen Reactions  . Erythromycin Nausea And Vomiting    No orders of the defined types were placed in this encounter.   AUTHORIZATION INFORMATION Primary Insurance: 1D#: Group #:  Secondary Insurance: 1D#: Group #:  SCHEDULE INFORMATION: Date: 08/19/18 Time: Location:[facility]

## 2018-08-16 ENCOUNTER — Telehealth: Payer: Self-pay

## 2018-08-16 NOTE — Telephone Encounter (Signed)
Patient contacted office to cancel her colonoscopy.  She will call back to reschedule colonoscopy.  Procedure was scheduled for 08/19/18 at [facility] with Dr. Vicente Males.  Trish in Endoscopy has been made aware.  Thanks [employer]

## 2018-08-19 ENCOUNTER — Ambulatory Visit: Admit: 2018-08-19 | Payer: 59 | Admitting: Gastroenterology

## 2018-08-19 SURGERY — COLONOSCOPY WITH PROPOFOL
Anesthesia: General

## 2018-09-01 ENCOUNTER — Telehealth: Payer: Self-pay

## 2018-09-01 NOTE — Telephone Encounter (Signed)
LVM for pt to call office to see if she would like to reschedule her screening colonoscopy to June/July with Dr. Vicente Males.  Thanks [employer]

## 2019-01-30 IMAGING — US US TRANSVAGINAL NON-OB
1 series · 13 of 25 positions shown · non-contrast
Comparison: None

CLINICAL DATA: LEFT-sided pelvic pain. Abnormal uterine bleeding
for 6 weeks. LMP 01/21/2018.



[Series 1: us transvaginal non-ob · 0.22mm/px · 13 of 122 slices shown]
[im 1/122]
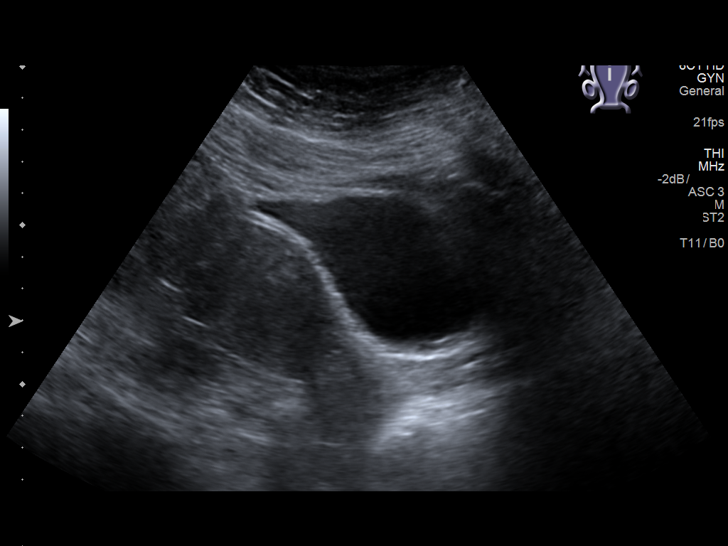
[im 11/122]
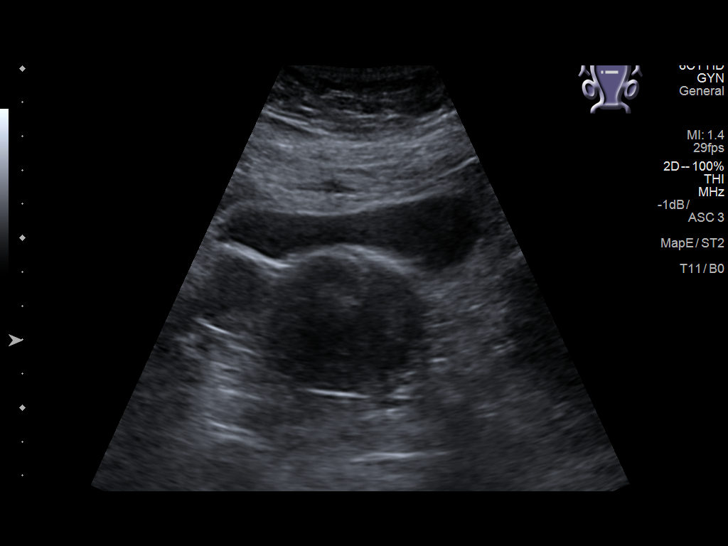
[im 21/122]
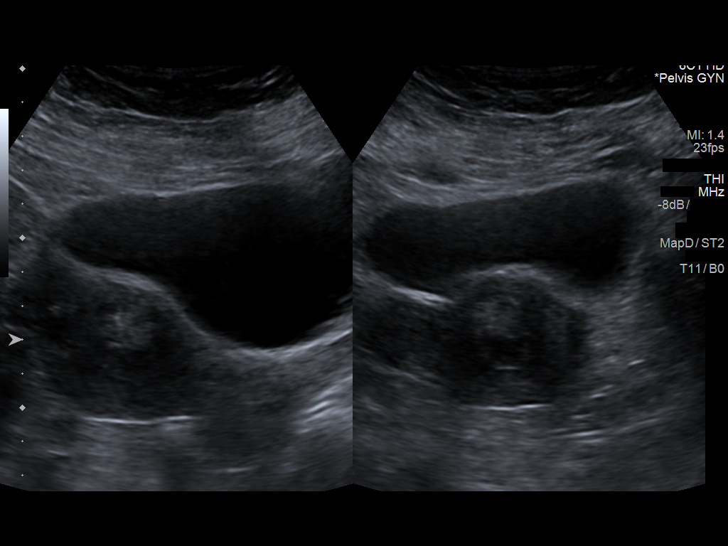
[im 31/122]
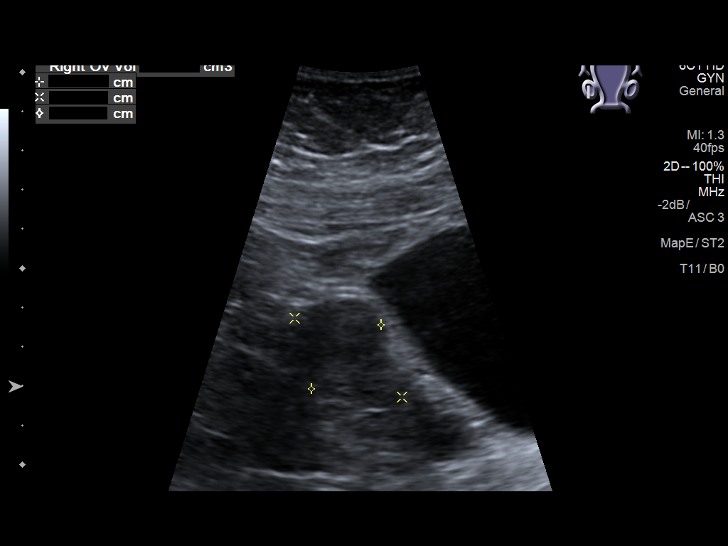
[im 41/122]
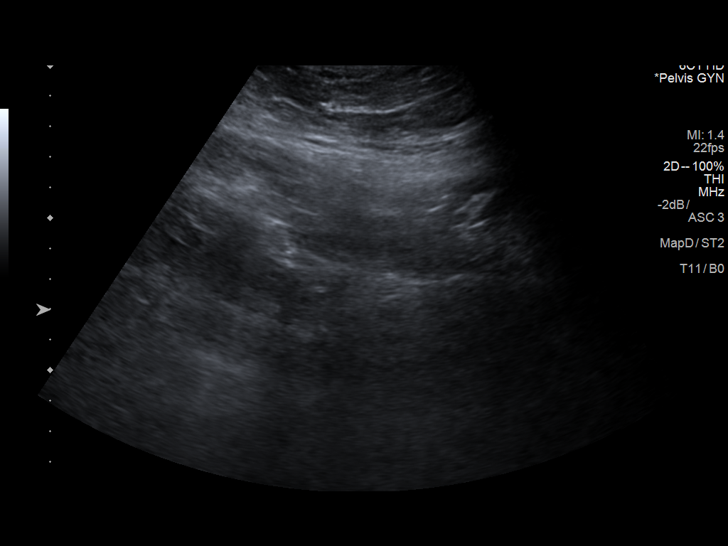
[im 51/122]
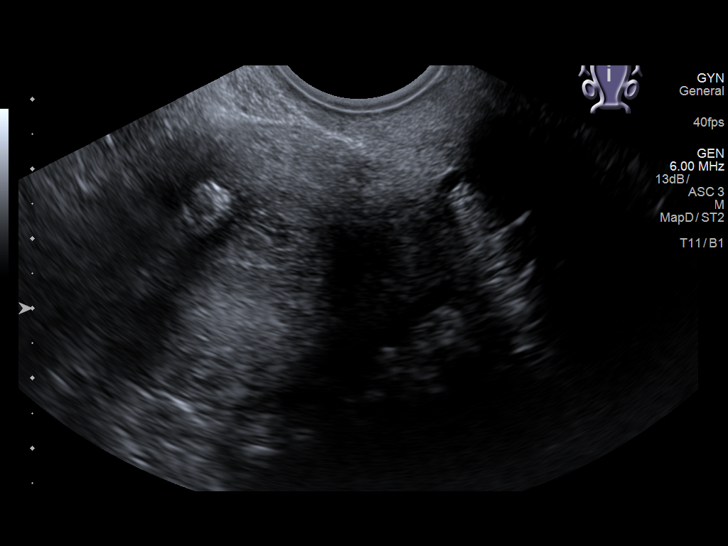
[im 61/122]
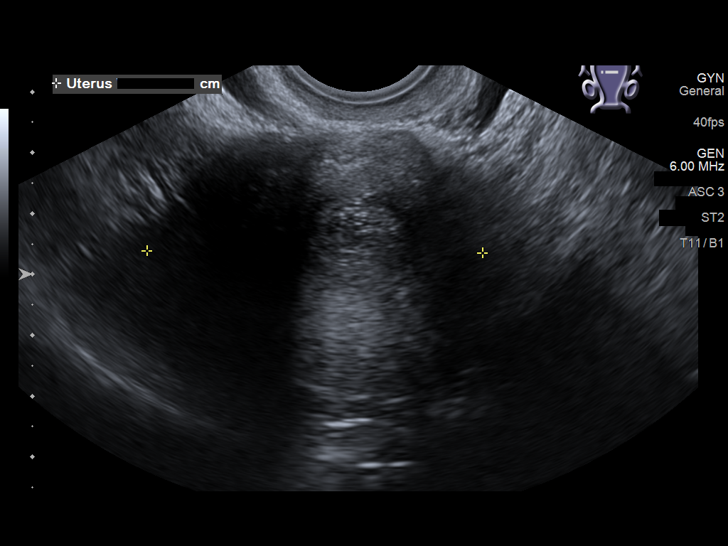
[im 71/122]
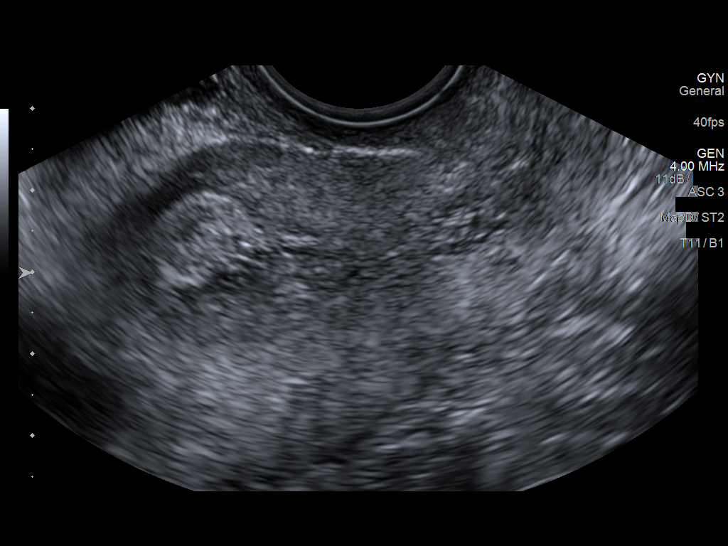
[im 81/122]
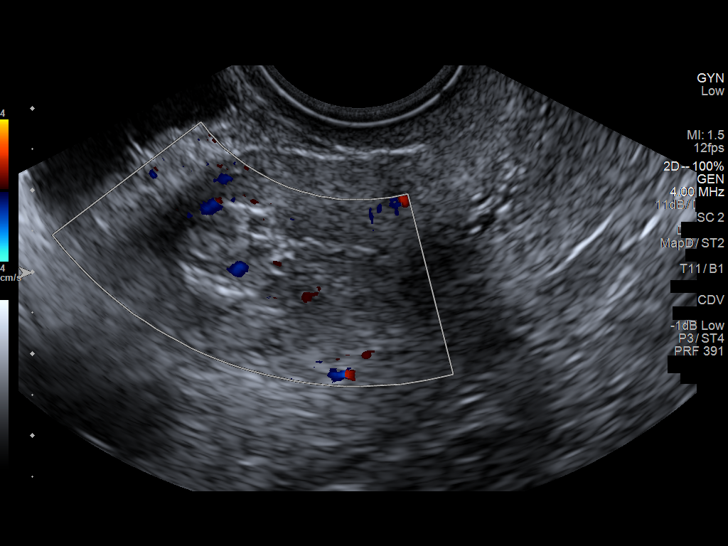
[im 91/122]
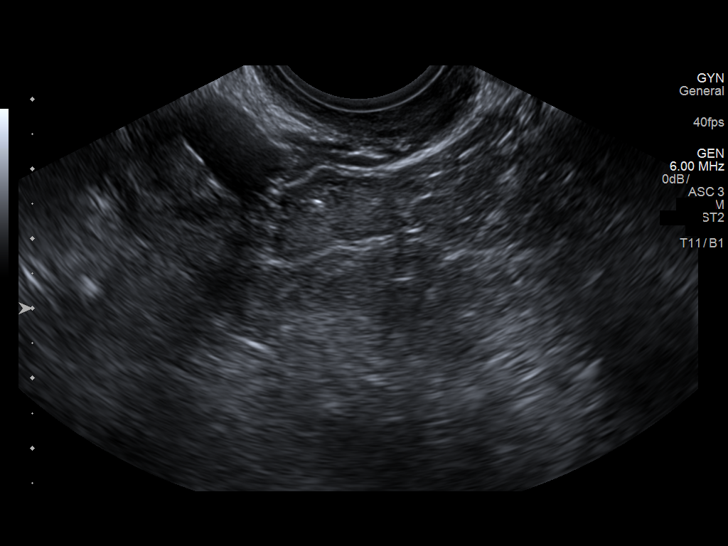
[im 101/122]
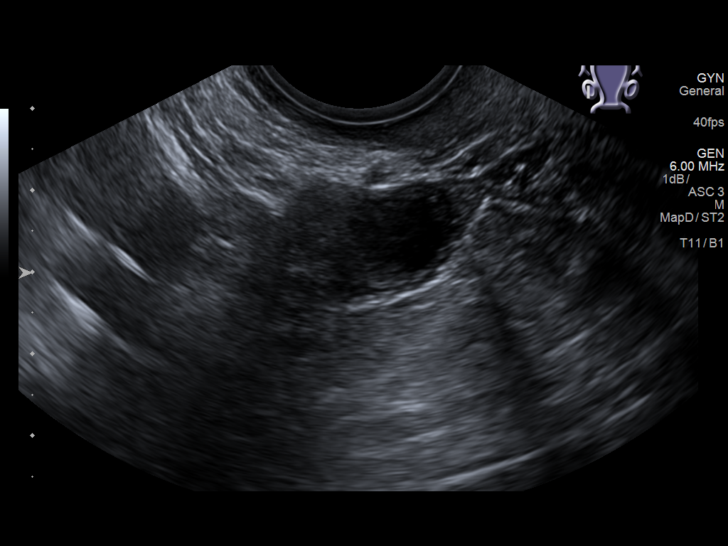
[im 111/122]
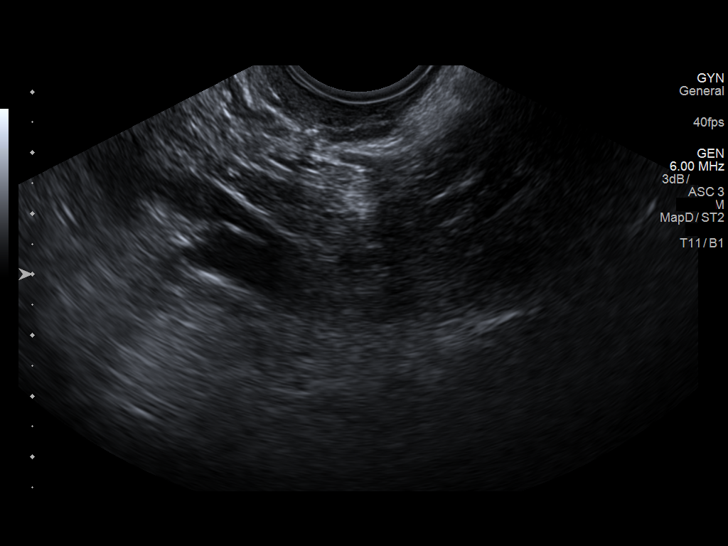
[im 122/122]
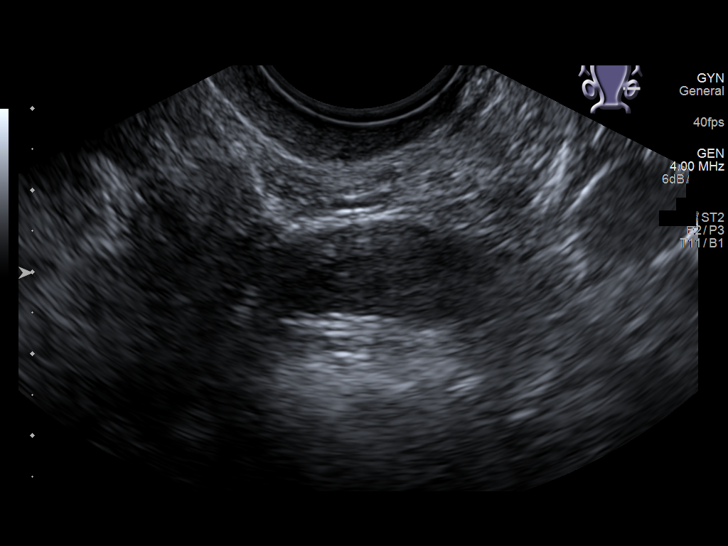

[13 of 25 positions shown; findings below may reference images not displayed]

FINDINGS: Uterus

Measurements: 8.0 x 4.3 x 5.5 centimeters. Within the central
uterine fundus, there is a heterogeneous echogenic mass measuring
1.5 x 1.3 x 2.2 centimeters. This mass demonstrates internal blood
flow on Doppler evaluation and is likely contiguous with the
endometrium.

Endometrium

Thickness: Normal endometrium is not well seen..  See above.

Right ovary

Measurements: 2.8 x 1.6 x 2.3 centimeters. Normal appearance/no
adnexal mass.

Left ovary

Measurements: 2.8 x 1.9 x 1.9 centimeter. Normal appearance/no
adnexal mass

Other findings

No abnormal free fluid.
IMPRESSION: 1. Heterogeneous echogenic mass measuring 2.2 centimeters within the
central fundal region of the uterus.
2. Considerations include a submucosal fibroid or endometrial mass,
such as polyp, endometrial hyperplasia, or endometrial carcinoma.
The mass is vascular on Doppler evaluation.
3. Consider further evaluation with sonohysterogram for confirmation
prior to hysteroscopy. Endometrial sampling should also be
considered if patient is at high risk for endometrial carcinoma.
(Ref: Radiological Reasoning: Algorithmic Workup of Abnormal Vaginal
Bleeding with Endovaginal Sonography and Sonohysterography. AJR
2998; 191:[id number])

## 2019-08-07 DIAGNOSIS — H02834 Dermatochalasis of left upper eyelid: Secondary | ICD-10-CM | POA: Diagnosis not present

## 2019-08-07 DIAGNOSIS — H02831 Dermatochalasis of right upper eyelid: Secondary | ICD-10-CM | POA: Diagnosis not present

## 2019-08-07 DIAGNOSIS — H43311 Vitreous membranes and strands, right eye: Secondary | ICD-10-CM | POA: Diagnosis not present

## 2019-08-07 DIAGNOSIS — H02402 Unspecified ptosis of left eyelid: Secondary | ICD-10-CM | POA: Diagnosis not present

## 2019-10-26 IMAGING — CR DG KNEE COMPLETE 4+V*L*
1 series · 4 of 4 positions shown · non-contrast
Comparison: None.

CLINICAL DATA: Bilateral knee pain

EXAM:
LEFT KNEE - COMPLETE 4+ VIEW

[Series 1: dg knee complete 4 views left · 0.14mm/px · 4 of 4 slices shown]
[im 1/4]
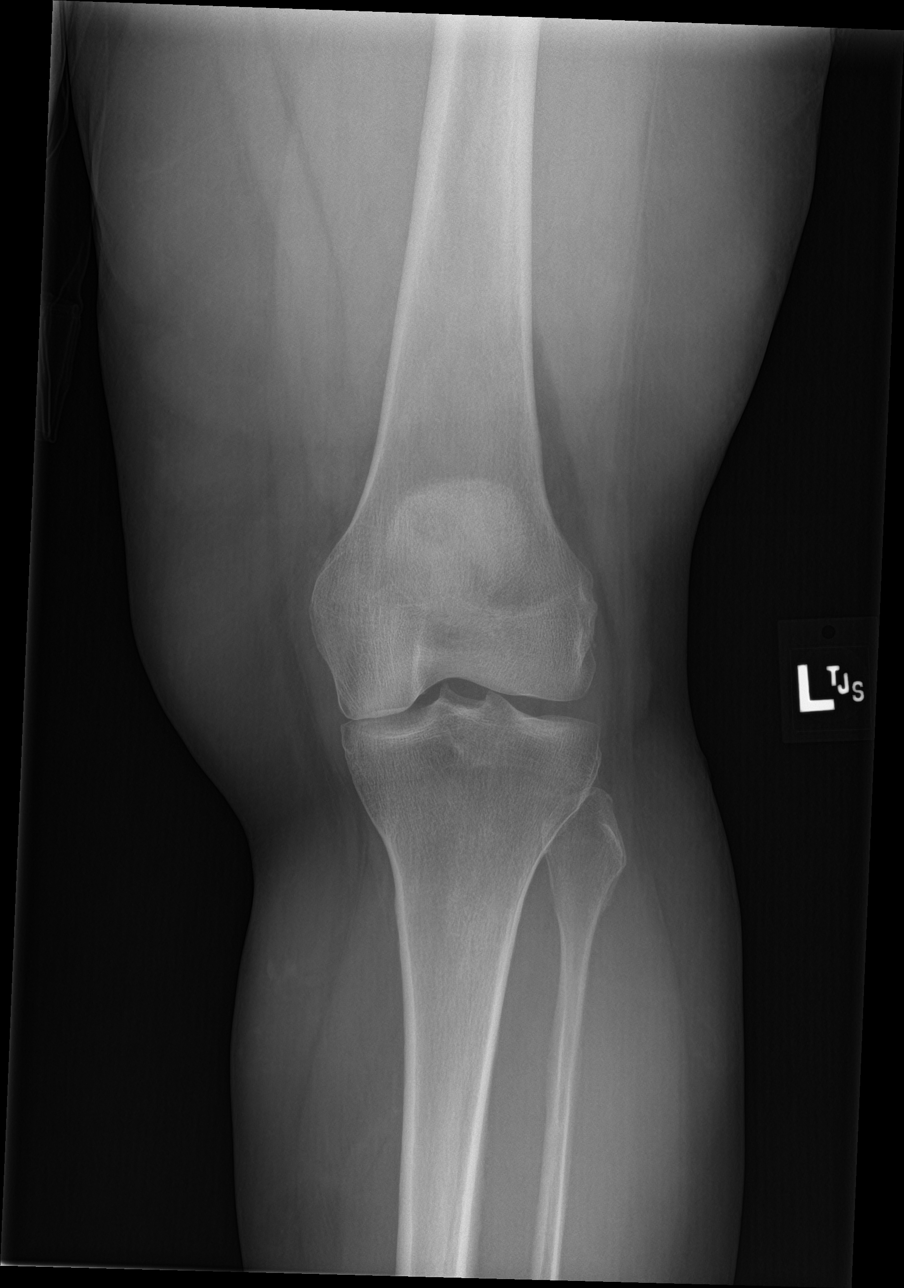
[im 2/4]
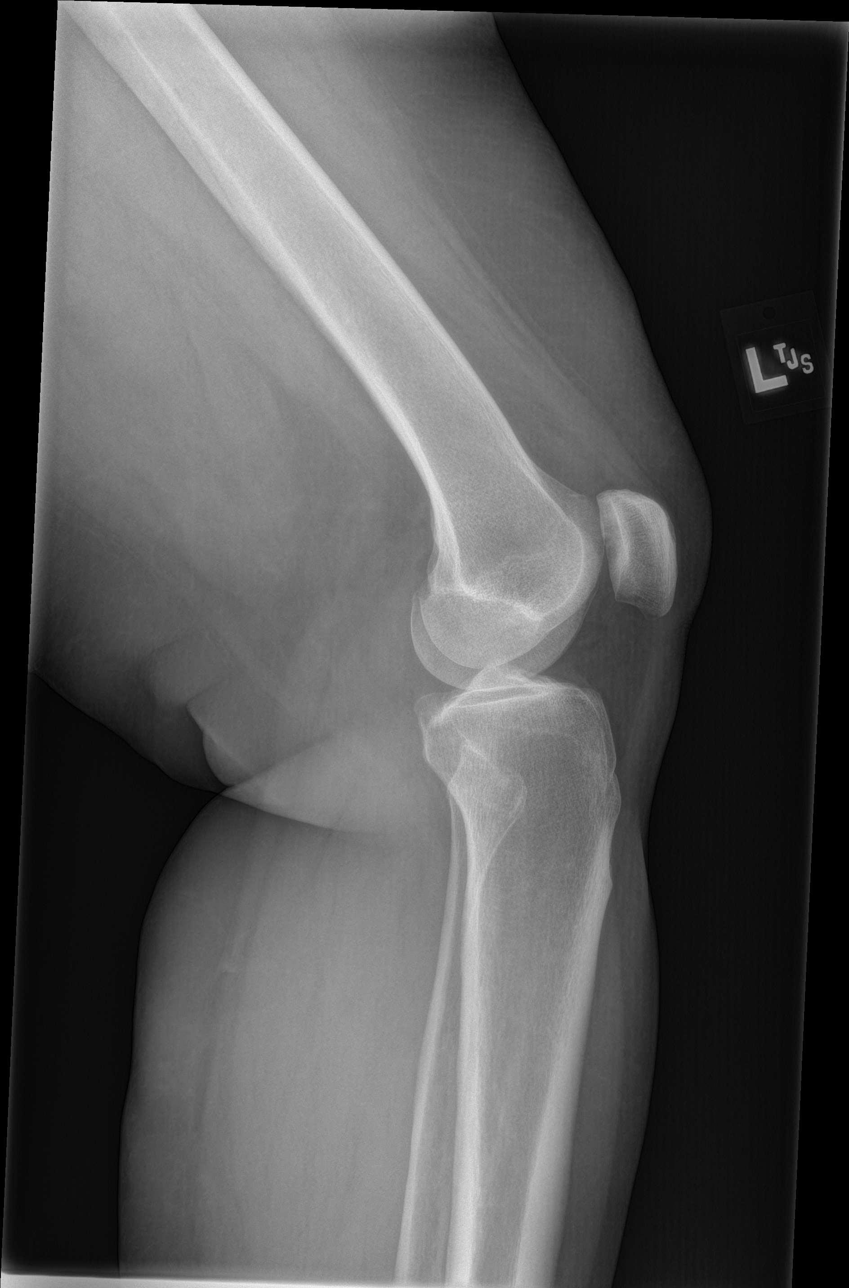
[im 3/4]
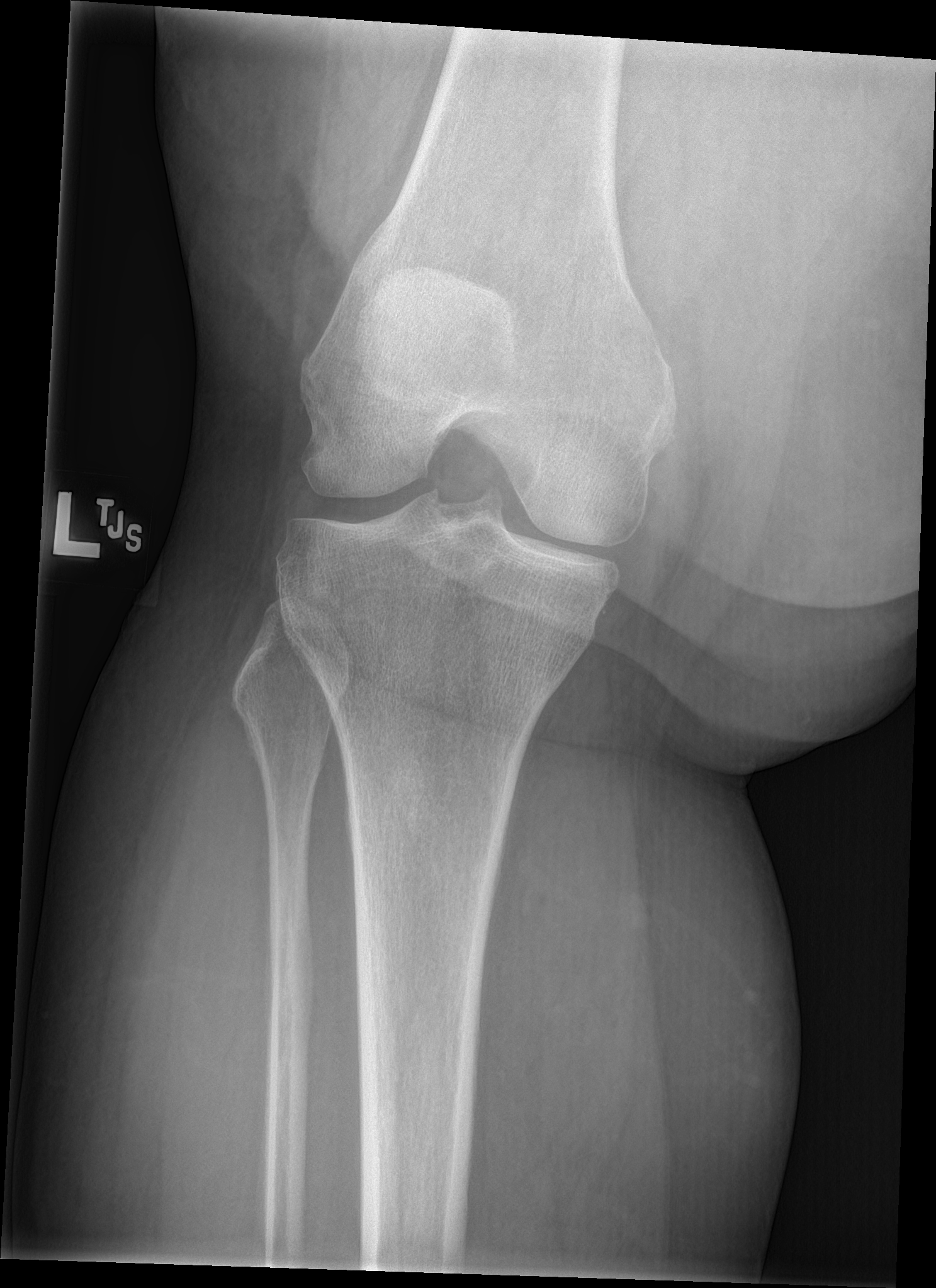
[im 4/4]
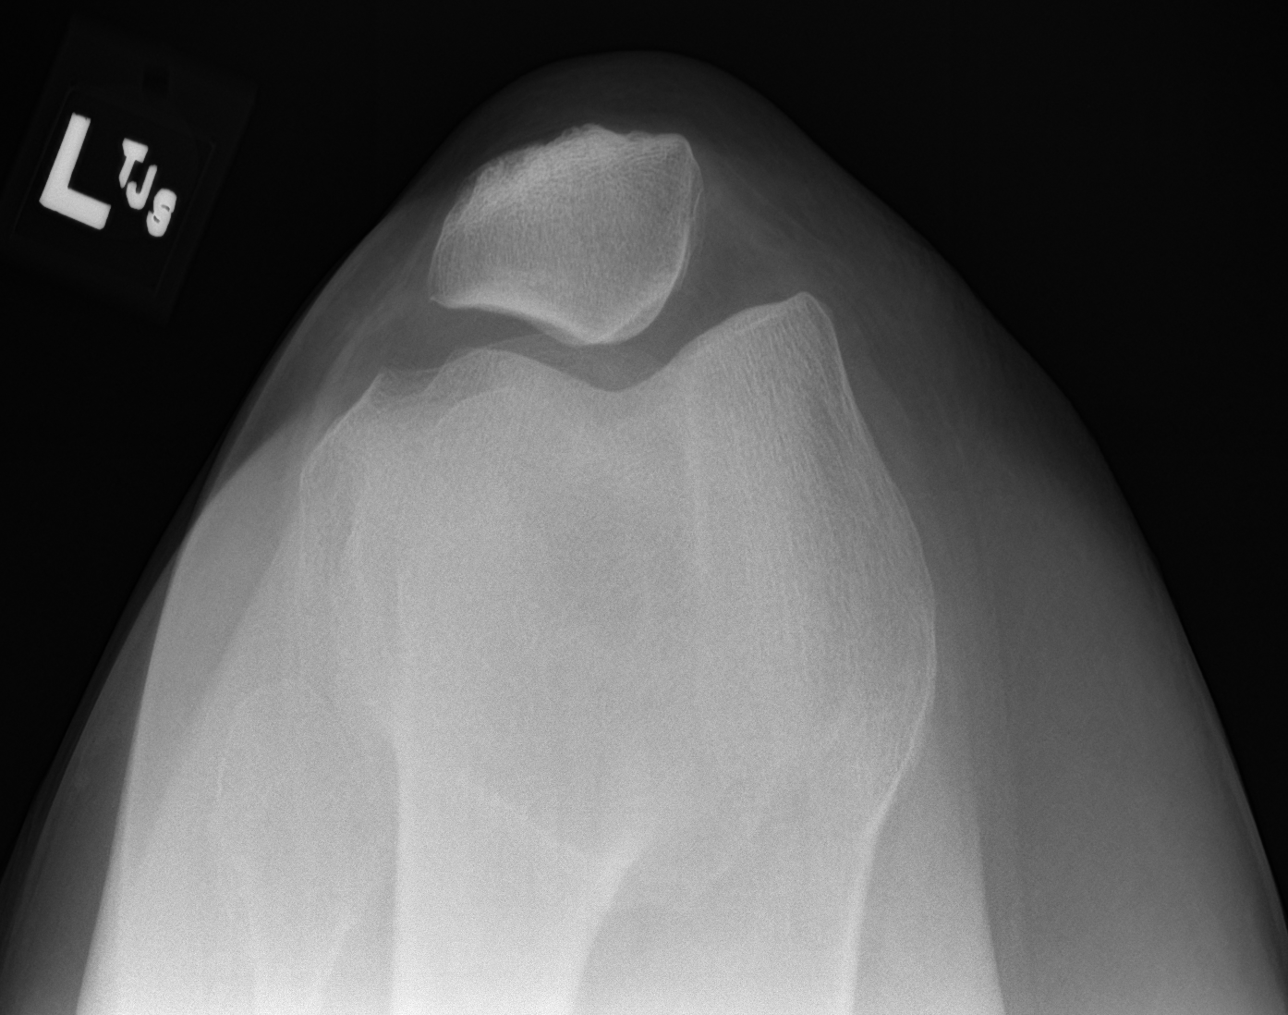

[4 of 4 positions shown; findings below may reference images not displayed]

FINDINGS: No fracture or malalignment. Small knee effusion. Mild degenerative
change of the medial compartment.
IMPRESSION: Mild degenerative change with small knee effusion.

## 2020-01-26 NOTE — Progress Notes (Signed)
Patient ID: Hailey Sheppard, female    DOB: 05-04-1966, 54 y.o.   MRN: [id number]  PCP: Arnetha Courser, MD  Chief Complaint  Patient presents with  . Groin Pain    started this past wednesday, started to feel pain left side and then became very nauseaous    Subjective:   Hailey Sheppard is a 54 y.o. female, presents to clinic with CC of the following:  Chief Complaint  Patient presents with  . Groin Pain    started this past wednesday, started to feel pain left side and then became very nauseaous    HPI:  Patient is a 54 year old female Her last visit at [facility] was 03/02/2018, with some left-sided lower quadrant and pelvic pain noted at that visit and work-up initiated.  An ultrasound showed a mass within the uterus prompting an OB/GYN referral and eventual surgery. She follows up today with left-sided groin pain  Her colonoscopy scheduled for March 2020 was canceled, and she was contacted to reschedule although has not done so as of yet. This was arranged due to genetic testing done in March, with a follow-up with OB/GYN noting the following:  Follow up discussion regarding recent genetic testing due to familial high risk factors (positive family history of ovarian cancer).   Her lab genetic panel was positive for PMS2 [facility]) with high risks for COLON and ENDOMETRIAL and elevated risks for ovarian, pancreatic,GI, GU, others. Breast cancer risk is further calculated based on the T-C model.  Even with negative gene testing, she still has a risk of 5 based on this calculation.  Recommendations are for yearly breast exams, mammograms, and MRI when this risk is > 20%.   Will plan GI referral, and needed colonoscopy soon.  She has never had one (she is 54 yo). Also counseled as to prophylactic TLH BSO for reducing risk for endometrial and ovarian cancer, as she is perimenopausal and done w child bearing.  To consider.  Genetic counselor information provided to pt and offered to  arrange.  She prefers not to see one at this time.  She had surgery done on 07/08/2019 by OB/GYN and then follow-up approximately a week later with the following noted:  Assessment: s/p :  operative hysteroscopy and myomectomy of submucus fibroid progressing well   Plan: Patient has done well after surgery with no apparent complications.  I have discussed the post-operative course to date, and the expected progress moving forward.  The patient understands what complications to be concerned about.  I will see the patient in routine follow up, or sooner if needed.    Activity plan: No restriction.  He notes that the recent left-sided groin pain started last Wednesday, and she noticed feeling more fatigue in association.  Also had some mild nausea without vomiting.  She went home from work Thursday, and rested, and rested Friday, and felt better over the weekend.  Remains better today.  Still notes some intermittent pain in that left suprapubic area, but is much improved. She denies any fevers recently, no dysuria with no burning with urination or frequency or blood in the urine.  Denies any change in bowel habits recently with no dark or black stools, no bleeding per rectum, no increased constipation, as she occasionally has that and has a MiraLAX product intake, with her last bowel movement yesterday and normal.  No diarrhea.  Denies any flank pains. Denies any pains higher up in the abdomen.  Denies any history  of kidney stones.  She states she has been in menopause for the last 2 years now, and not had periods over that time.  She notes she canceled the colonoscopy as she just did not want to do at that time.  Has not yet rescheduled, and does not want to do so today after discussion. Has not had the Covid vaccine. Was wearing a facemask, but not over the nose and when informed not very helpful and thus wears over the nose, she states she cannot as she gets claustrophobic.   Tobacco-never  smoker  Patient Active Problem List   Diagnosis Date Noted  . Carrier of gene mutation for high risk of cancer 08/04/2018  . Menorrhalgia 07/07/2018  . Fibroids, submucosal 07/07/2018  . Effusion of left knee joint 03/07/2018  . Degenerative arthritis of left knee 03/07/2018  . Degenerative arthritis of right knee 03/07/2018  . Preventative health care 12/16/2016  . Allergy 05/27/2016  . GERD (gastroesophageal reflux disease) 05/27/2016      Current Outpatient Medications:  .  acetaminophen (TYLENOL) 500 MG tablet, Take 500 mg by mouth every 6 (six) hours as needed for mild pain., Disp: , Rfl:  .  cholecalciferol (VITAMIN D) 1000 units tablet, Take 1 tablet (1,000 Units total) by mouth daily., Disp: , Rfl:  .  esomeprazole (NEXIUM) 20 MG capsule, Take 20 mg by mouth at bedtime. , Disp: , Rfl:  .  ibuprofen (ADVIL,MOTRIN) 200 MG tablet, Take 200 mg by mouth every 6 (six) hours as needed for mild pain., Disp: , Rfl:  .  loratadine (CLARITIN) 10 MG tablet, Take 10 mg by mouth daily as needed for allergies., Disp: , Rfl:  .  polyethylene glycol powder (GLYCOLAX/MIRALAX) powder, Take 17 g by mouth daily as needed. , Disp: , Rfl:  .  pseudoephedrine-acetaminophen (TYLENOL SINUS) 30-500 MG TABS tablet, Take 2-3 tablets by mouth daily. , Disp: , Rfl:  .  tetrahydrozoline-zinc (VISINE-AC) 0.05-0.25 % ophthalmic solution, Place 2 drops into both eyes 3 (three) times daily as needed., Disp: , Rfl:  .  Triamcinolone Acetonide (NASACORT AQ NA), Place 1 spray into the nose daily., Disp: , Rfl:  .  Misc Natural Products (DIETERS HERBAL COMBINATION PO), Take 2 capsules by mouth 2 (two) times daily. True Vision, Disp: , Rfl:    Allergies  Allergen Reactions  . Erythromycin Nausea And Vomiting     Past Surgical History:  Procedure Laterality Date  . APPENDECTOMY  1980   Open with Bowel Resection due to Perforation  . CHOLECYSTECTOMY N/A 06/17/2016   Procedure: LAPAROSCOPIC CHOLECYSTECTOMY;   Surgeon: Florene Glen, MD;  Location: [facility] ORS;  Service: General;  Laterality: N/A;  . COLON SURGERY  1980   Bowel Resection during Appendectomy secondary to Perforation  . DILATATION & CURETTAGE/HYSTEROSCOPY WITH TRUECLEAR N/A 07/07/2018   Procedure: DILATATION & CURETTAGE/HYSTEROSCOPY WITH TRUCLEAR;  Surgeon: Gae Dry, MD;  Location: [facility] ORS;  Service: Gynecology;  Laterality: N/A;  . GALLBLADDER SURGERY     januray 17th 2018     Family History  Problem Relation Age of Onset  . Diabetes Mother   . Obesity Mother   . Diabetes Father   . Gallbladder disease Brother   . Ovarian cancer Maternal Grandmother 63  . Heart disease Maternal Grandfather   . Gallbladder disease Maternal Grandfather   . Stroke Paternal Grandmother   . Kidney cancer Paternal Grandmother      Social History   Tobacco Use  . Smoking status: Never  Smoker  . Smokeless tobacco: Never Used  Substance Use Topics  . Alcohol use: Yes    Comment: rare    With staff assistance, above reviewed with the patient today.  ROS: As per HPI, otherwise no specific complaints on a limited and focused system review   No results found for this or any previous visit (from the past 72 hour(s)).   PHQ2/9: Depression screen [facility] 2/9 01/29/2020 03/02/2018 12/16/2016  Decreased Interest 0 0 0  Down, Depressed, Hopeless 0 0 0  PHQ - 2 Score 0 0 0  Altered sleeping - 0 -  Tired, decreased energy - 0 -  Change in appetite - 0 -  Feeling bad or failure about yourself  - 0 -  Trouble concentrating - 0 -  Moving slowly or fidgety/restless - 0 -  Suicidal thoughts - 0 -  PHQ-9 Score - 0 -  Difficult doing work/chores - Not difficult at all -   PHQ-2/9 Result is neg  Fall Risk: Fall Risk  01/29/2020 03/02/2018 12/16/2016  Falls in the past year? 1 No No  Number falls in past yr: 0 - -  Injury with Fall? 1 - -  Follow up Falls evaluation completed - -      Objective:   Vitals:   01/29/20 0924  BP: 122/82   Pulse: 92  Resp: 16  Temp: 97.6 F (36.4 C)  TempSrc: Oral  SpO2: 99%  Weight: 211 lb 1.6 oz (95.8 kg)  Height: '5\' 2"'  (1.575 m)    Body mass index is 38.61 kg/m.  Physical Exam   NAD, masked, not toxic appearing, pleasant HEENT - [place]/AT, sclera anicteric, PERRL, EOMI, conj - non-inj'ed, pharynx clear Neck - supple, no adenopathy, no rigidity Car - RRR without m/g/r, not tachycardic Pulm- RR and effort normal at rest, CTA without wheeze or rales Abd - soft, obese NT diffusely, ND, BS+,  no obvious masses, mildly tender in the left suprapubic region below the waistline to palpation, no rebound or guarding in this region, no marked swelling compared to the opposite side, minimally tender at best on the right suprapubic region, although she noted much worse on the left and has not had significant right suprapubic discomfort recently. Back - no CVA tenderness Skin- no rash noted on exposed areas, patient denied otherwise Ext - no marked LE edema,  Neuro/psychiatric - affect was not flat, appropriate with conversation  Alert   Speech normal   Results for orders placed or performed during the hospital encounter of 07/07/18  Glucose, capillary  Result Value Ref Range   Glucose-Capillary 65 (L) 70 - 99 mg/dL  Pregnancy, urine POC  Result Value Ref Range   Preg Test, Ur NEGATIVE NEGATIVE  ABO/Rh  Result Value Ref Range   ABO/RH(D)      O POS Performed at [facility], [address] [place], [place] [address]   Surgical pathology  Result Value Ref Range   SURGICAL PATHOLOGY      Surgical Pathology CASE: [id number] PATIENT: Hailey Sheppard Surgical Pathology Report     SPECIMEN SUBMITTED: A. Endocervical curettings B. Endometrial fibroid  CLINICAL HISTORY: None provided  PRE-OPERATIVE DIAGNOSIS: Uterine mass, menorrhagia  POST-OPERATIVE DIAGNOSIS: Same as pre op     DIAGNOSIS: A. ENDOCERVIX; CURETTAGE: - ONE FRAGMENT SUGGESTIVE OF A POLYP,  ENDOCERVICAL/ENDOMETRIAL TYPE, ADMIXED WITH FRAGMENTS OF ENDOCERVICAL TISSUE AND SQUAMOUS EPITHELIUM. - NEGATIVE FOR ATYPIA AND MALIGNANCY.  B. UTERINE CAVITY; HYSTEROSCOPIC MYOMECTOMY: - SUBMUCOSAL LEIOMYOMA, MULTIPLE FRAGMENTS. - PROLIFERATIVE  ENDOMETRIUM. - NEGATIVE FOR ATYPIA AND MALIGNANCY.   GROSS DESCRIPTION: A. Labeled: Endocervical curettings Received: Formalin Tissue fragment(s): Multiple Size: Aggregate, 2.5 x 1.2 x 0.2 cm Description: Mucinous/hemorrhagic material.  Filtered into a mesh bag. Entirely submitted in 1 cassette.  B. Labeled: Endometrial fibroid Received: For malin Tissue fragment(s): Multiple Size: Aggregate, 5.0 x 2.0 x 0.4 cm Description: Irregular fragments of pale-tan soft tissue admixed with very minimal hemorrhagic material.  Filtered into mesh bags. Entirely submitted in cassettes 1-3.    Final Diagnosis performed by Bryan Lemma, MD.   Electronically signed 07/08/2018 6:55:05PM The electronic signature indicates that the named Attending Pathologist has evaluated the specimen  Technical component performed at [facility], [address] [place], [place] [address] Lab: [phone] Dir: Rush Farmer, MD, MMM  Professional component performed at [facility], [facility], [place], [place], [place] [address] Lab: [phone] Dir: Dellia Nims. Rubinas, MD    Urine dip with small leukocytes, otherwise completely negative.    Assessment & Plan:   1. Carrier of gene mutation for high risk of cancer Noted this in her history, and discussed the previous recommendation to have the colonoscopy, and recommended rescheduling, and she really does not want to pursue that presently.  2. Suprapubic pain Discussed potential sources, and noted some concerns especially with the above noted.  Do feel having a follow-up with [facility] would be best presently, and she agreed.  A referral was placed for that, and asked that she call them as well  to help get that scheduled. Did check a urine today with the urine dip having just small leukocytes, otherwise negative, with no real infectious concerns without frequency or dysuria concerns.  Await input from OB/GYN after their assessment, and the patient was very much in agreement with this approach presently.       Towanda Malkin, MD 01/29/20 9:31 AM

## 2020-01-29 ENCOUNTER — Other Ambulatory Visit: Payer: Self-pay

## 2020-01-29 ENCOUNTER — Ambulatory Visit: Payer: 59 | Admitting: Internal Medicine

## 2020-01-29 ENCOUNTER — Ambulatory Visit (INDEPENDENT_AMBULATORY_CARE_PROVIDER_SITE_OTHER): Payer: BC Managed Care – PPO | Admitting: Obstetrics & Gynecology

## 2020-01-29 ENCOUNTER — Encounter: Payer: Self-pay | Admitting: Internal Medicine

## 2020-01-29 ENCOUNTER — Encounter: Payer: Self-pay | Admitting: Obstetrics & Gynecology

## 2020-01-29 VITALS — BP 140/80 | Ht 62.0 in | Wt 213.0 lb

## 2020-01-29 VITALS — BP 122/82 | HR 92 | Temp 97.6°F | Resp 16 | Ht 62.0 in | Wt 211.1 lb

## 2020-01-29 DIAGNOSIS — R1032 Left lower quadrant pain: Secondary | ICD-10-CM | POA: Diagnosis not present

## 2020-01-29 DIAGNOSIS — R102 Pelvic and perineal pain: Secondary | ICD-10-CM

## 2020-01-29 DIAGNOSIS — Z148 Genetic carrier of other disease: Secondary | ICD-10-CM | POA: Diagnosis not present

## 2020-01-29 DIAGNOSIS — D219 Benign neoplasm of connective and other soft tissue, unspecified: Secondary | ICD-10-CM

## 2020-01-29 LAB — POCT URINALYSIS DIPSTICK
Appearance: NORMAL
Bilirubin, UA: NEGATIVE
Blood, UA: NEGATIVE
Glucose, UA: NEGATIVE
Ketones, UA: NEGATIVE
Nitrite, UA: NEGATIVE
Odor: NORMAL
Protein, UA: NEGATIVE
Spec Grav, UA: 1.01 (ref 1.010–1.025)
Urobilinogen, UA: 0.2 E.U./dL
pH, UA: 6 (ref 5.0–8.0)

## 2020-01-29 NOTE — Addendum Note (Signed)
Addended by: Lennie Muckle on: 01/29/2020 11:17 AM   Modules accepted: Orders

## 2020-01-29 NOTE — Progress Notes (Signed)
° °Gynecology Pelvic Pain Evaluation  ° °Chief Complaint: LLQ pain ° ° °History of Present Illness:   Patient is a 54 y.o. who LMP was Patient's last menstrual period was 01/21/2018 (exact date)., presents today for a problem visit.  She complains of pain mostly in the LLQ for the last 2 days; has had pain off and on but worse these past two days.  No radiation.  No modifiers.  Associated- mild nausea.  No VB.  Reports hot flashes and no periods in 2 years.  Prior h/o finroids; s/p hysteroscopy 1.5 years ago.  Carrier of Lynch, awaiting Covid to pass for consideration of prophylactic hysterectomy..  ° °PMHx: °She  has a past medical history of Allergy, Anxiety, Arthritis, Complication of anesthesia, Family history of ovarian cancer, GERD (gastroesophageal reflux disease), Headache, Hypertension, and PMS2-related Lynch syndrome (HNPCC4) (07/2018). Also,  has a past surgical history that includes Appendectomy (1980); Colon surgery (1980); Cholecystectomy (N/A, 06/17/2016); Gallbladder surgery; and Dilatation & curettage/hysteroscopy with trueclear (N/A, 07/07/2018)., family history includes Diabetes in her father and mother; Gallbladder disease in her brother and maternal grandfather; Heart disease in her maternal grandfather; Kidney cancer in her paternal grandmother; Obesity in her mother; Ovarian cancer (age of onset: 65) in her maternal grandmother; Stroke in her paternal grandmother.,  reports that she has never smoked. She has never used smokeless tobacco. She reports current alcohol use. She reports that she does not use drugs. ° °She has a current medication list which includes the following prescription(s): acetaminophen, cholecalciferol, esomeprazole, ibuprofen, loratadine, misc natural products, polyethylene glycol powder, pseudoephedrine-acetaminophen, tetrahydrozoline-zinc, and triamcinolone acetonide. Also, is allergic to erythromycin. ° °Review of Systems  °Constitutional: Negative for chills, fever and  malaise/fatigue.  °HENT: Negative for congestion, sinus pain and sore throat.   °Eyes: Negative for blurred vision and pain.  °Respiratory: Negative for cough and wheezing.   °Cardiovascular: Negative for chest pain and leg swelling.  °Gastrointestinal: Positive for abdominal pain. Negative for constipation, diarrhea, heartburn, nausea and vomiting.  °Genitourinary: Negative for dysuria, frequency, hematuria and urgency.  °Musculoskeletal: Negative for back pain, joint pain, myalgias and neck pain.  °Skin: Negative for itching and rash.  °Neurological: Negative for dizziness, tremors and weakness.  °Endo/Heme/Allergies: Does not bruise/bleed easily.  °Psychiatric/Behavioral: Negative for depression. The patient is not nervous/anxious and does not have insomnia.   ° ° °Objective: °BP 140/80    Ht 5' 2" (1.575 m)    Wt 213 lb (96.6 kg)    LMP 01/21/2018 (Exact Date)    BMI 38.96 kg/m²  °Physical Exam °Constitutional:   °   General: She is not in acute distress. °   Appearance: She is well-developed.  °Genitourinary:  °   Pelvic exam was performed with patient supine.  °   Vagina and uterus normal.  °   No vaginal erythema or bleeding.  °   No cervical motion tenderness, discharge, polyp or nabothian cyst.  °   Uterus is mobile.  °   Uterus is not enlarged.  °   No uterine mass detected. °   Uterus is midaxial.  °   No right or left adnexal mass present.  °   Right adnexa not tender.  °   Left adnexa tender.  °HENT:  °   Head: Normocephalic and atraumatic.  °   Nose: Nose normal.  °Abdominal:  °   General: There is no distension.  °   Palpations: Abdomen is soft.  °   Tenderness: There is   abdominal tenderness in the left lower quadrant.  °Musculoskeletal:     °   General: Normal range of motion.  °Neurological:  °   Mental Status: She is alert and oriented to person, place, and time.  °   Cranial Nerves: No cranial nerve deficit.  °Skin: °   General: Skin is warm and dry.  °Psychiatric:     °   Attention and  Perception: Attention normal.     °   Mood and Affect: Mood and affect normal.     °   Speech: Speech normal.     °   Behavior: Behavior normal.     °   Thought Content: Thought content normal.     °   Judgment: Judgment normal.  ° ° ° °Female chaperone present for pelvic portion of the physical exam ° °Assessment: 54 y.o. G0P0000 with new onset pain. ° °1. LLQ pain ° °2. Fibroids °Prior h/o fibroids ° °US to assess for etiology for pain °Also considering TLH BSO for Lynch gene cancer risk °Needs colonoscopy as well ° °A total of 20 minutes were spent face-to-face with the patient as well as preparation, review, communication, and documentation during this encounter.  ° °Paul Harris, MD, FACOG °[facility], [facility] °01/29/2020  4:38 PM ° °

## 2020-01-29 NOTE — Patient Instructions (Signed)
Referral provided today to [facility], and can call to make an appointment with them to help as well noting you have been there previous.

## 2020-01-30 ENCOUNTER — Other Ambulatory Visit: Payer: Self-pay | Admitting: Obstetrics and Gynecology

## 2020-01-30 ENCOUNTER — Other Ambulatory Visit: Payer: Self-pay | Admitting: Obstetrics & Gynecology

## 2020-01-30 ENCOUNTER — Ambulatory Visit (INDEPENDENT_AMBULATORY_CARE_PROVIDER_SITE_OTHER): Payer: BC Managed Care – PPO

## 2020-01-30 DIAGNOSIS — D219 Benign neoplasm of connective and other soft tissue, unspecified: Secondary | ICD-10-CM

## 2020-01-31 ENCOUNTER — Other Ambulatory Visit: Payer: Self-pay | Admitting: Obstetrics & Gynecology

## 2020-01-31 NOTE — Progress Notes (Signed)
Review of ULTRASOUND.    I have personally reviewed images and report of recent ultrasound done at [facility].    Plan of management to be discussed with patient.    Discussed treatment options, and due to regrowth of fibroids since 2019 and Lynch genetic risks, recommend hysterectomy.  Recovery discussed.  To consider.  Otherwise, plan follow up US in 4-6 mos.     Barnett Applebaum, MD, [facility], [facility] 01/31/2020  8:35 AM

## 2020-02-02 ENCOUNTER — Telehealth: Payer: Self-pay | Admitting: Obstetrics & Gynecology

## 2020-02-02 NOTE — Telephone Encounter (Signed)
I rec'd a call from pt saying she was ready to schedule the [place] with Harris. We discussed available dates and she decided on 02/29/20.  I adv pt that I would call her back once I rec'd the surgery request from [facility].

## 2020-02-05 ENCOUNTER — Other Ambulatory Visit: Payer: Self-pay | Admitting: Obstetrics & Gynecology

## 2020-02-05 DIAGNOSIS — Z148 Genetic carrier of other disease: Secondary | ICD-10-CM

## 2020-02-05 DIAGNOSIS — D219 Benign neoplasm of connective and other soft tissue, unspecified: Secondary | ICD-10-CM

## 2020-02-05 DIAGNOSIS — R1032 Left lower quadrant pain: Secondary | ICD-10-CM

## 2020-02-05 NOTE — Telephone Encounter (Signed)
Surgery Booking Request Patient Full Name:  Hailey Sheppard  MRN: [id number]  DOB: Jan 03, 1966  Surgeon: Hoyt Koch, MD  Requested Surgery Date and Time: 02/29/2020 Primary Diagnosis AND Code:   ICD-10-CM  1. Fibroids  D21.9  2. LLQ pain  R10.32  3. Carrier of gene mutation for high risk of cancer  Z14.8  Secondary Diagnosis and Code:  Surgical Procedure: TLH/BSO RNFA Requested?: No L&D Notification: No Admission Status: same day surgery Length of Surgery: 75 min Special Case Needs: No H&P: Yes Phone Interview???:  Yes Interpreter: No Medical Clearance:  No Special Scheduling Instructions: No Any known health/anesthesia issues, diabetes, sleep apnea, latex allergy, defibrillator/pacemaker?: No Acuity: P3   (P1 highest, P2 delay may cause harm, P3 low, elective gyn, P4 lowest)

## 2020-02-19 ENCOUNTER — Ambulatory Visit (INDEPENDENT_AMBULATORY_CARE_PROVIDER_SITE_OTHER): Payer: BC Managed Care – PPO | Admitting: Obstetrics & Gynecology

## 2020-02-19 ENCOUNTER — Other Ambulatory Visit: Payer: Self-pay

## 2020-02-19 ENCOUNTER — Encounter: Payer: Self-pay | Admitting: Obstetrics & Gynecology

## 2020-02-19 ENCOUNTER — Telehealth: Payer: Self-pay | Admitting: Obstetrics & Gynecology

## 2020-02-19 VITALS — BP 120/80 | Ht 62.0 in | Wt 193.0 lb

## 2020-02-19 DIAGNOSIS — D219 Benign neoplasm of connective and other soft tissue, unspecified: Secondary | ICD-10-CM

## 2020-02-19 DIAGNOSIS — Z148 Genetic carrier of other disease: Secondary | ICD-10-CM

## 2020-02-19 DIAGNOSIS — R1032 Left lower quadrant pain: Secondary | ICD-10-CM

## 2020-02-19 NOTE — Telephone Encounter (Signed)
Called patient to schedule TLH/BSO w [facility]  DOS 9/30  H&P 9/20 @ 4:10   Covid testing 9/28 @ 9-10:30, [address], drive up and wear mask. Advised pt to quarantine until DOS.  Pre-admit phone call appointment to be requested - date and time will be included on H&P paper work. Also all appointments will be updated on pt MyChart. Explained that this appointment has a call window. Based on the time scheduled will indicate if the call will be received within a 4 hour window before 1:00 or after.  Advised that pt may also receive calls from the hospital pharmacy and pre-service center.  Confirmed pt has [employer] as Chartered certified accountant. No secondary insurance.  ___________________________________  Surgery Booking Request Patient Full Name:  Hailey Sheppard  MRN: [id number]  DOB: [birth year]  Surgeon: Hoyt Koch, MD  Requested Surgery Date and Time: 02/29/2020 Primary Diagnosis AND Code:                 ICD-10-CM     1.        Fibroids         D21.9   2.        LLQ pain       R10.32           3.        Carrier of gene mutation for high risk of cancer  Z14.8   Secondary Diagnosis and Code:  Surgical Procedure: TLH/BSO RNFA Requested?: No L&D Notification: No Admission Status: same day surgery Length of Surgery: 75 min Special Case Needs: No H&P: Yes Phone Interview???:  Yes Interpreter: No Medical Clearance:  No Special Scheduling Instructions: No Any known health/anesthesia issues, diabetes, sleep apnea, latex allergy, defibrillator/pacemaker?: No Acuity: P3   (P1 highest, P2 delay may cause harm, P3 low, elective gyn, P4 lowest)

## 2020-02-19 NOTE — Patient Instructions (Signed)
PRE ADMISSION TESTING For Covid, prior to procedure Tuesday 9:00-10:00 [facility] entrance (drive up)  Results in 48-72 hours You will not receive notification if test results are negative. If positive for Covid19, your provider will notify you by phone, with additional instructions.   Total Laparoscopic Hysterectomy, Care After This sheet gives you information about how to care for yourself after your procedure. Your health care provider may also give you more specific instructions. If you have problems or questions, contact your health care provider. What can I expect after the procedure? After the procedure, it is common to have:  Pain and bruising around your incisions.  A sore throat, if a breathing tube was used during surgery.  Fatigue.  Poor appetite.  Less interest in sex. If your ovaries were also removed, it is also common to have symptoms of menopause such as hot flashes, night sweats, and lack of sleep (insomnia). Follow these instructions at home: Bathing  Do not take baths, swim, or use a hot tub until your health care provider approves. You may need to only take showers for 2-3 weeks.  Keep your bandage (dressing) dry until your health care provider says it can be removed. Incision care   Follow instructions from your health care provider about how to take care of your incisions. Make sure you: ? Wash your hands with soap and water before you change your dressing. If soap and water are not available, use hand sanitizer. ? Change your dressing as told by your health care provider. ? Leave stitches (sutures), skin glue, or adhesive strips in place. These skin closures may need to stay in place for 2 weeks or longer. If adhesive strip edges start to loosen and curl up, you may trim the loose edges. Do not remove adhesive strips completely unless your health care provider tells you to do that.  Check your incision area every day for signs of infection.  Check for: ? Redness, swelling, or pain. ? Fluid or blood. ? Warmth. ? Pus or a bad smell. Activity  Get plenty of rest and sleep.  Do not lift anything that is heavier than 10 lbs (4.5 kg) for one month after surgery, or as long as told by your health care provider.  Do not drive or use heavy machinery while taking prescription pain medicine.  Do not drive for 24 hours if you were given a medicine to help you relax (sedative).  Return to your normal activities as told by your health care provider. Ask your health care provider what activities are safe for you. Lifestyle   Do not use any products that contain nicotine or tobacco, such as cigarettes and e-cigarettes. These can delay healing. If you need help quitting, ask your health care provider.  Do not drink alcohol until your health care provider approves. General instructions  Do not douche, use tampons, or have sex for at least 6 weeks, or as told by your health care provider.  Take over-the-counter and prescription medicines only as told by your health care provider.  To monitor yourself for a fever, take your temperature at least once a day during recovery.  If you struggle with physical or emotional changes after your procedure, speak with your health care provider or a therapist.  To prevent or treat constipation while you are taking prescription pain medicine, your health care provider may recommend that you: ? Drink enough fluid to keep your urine clear or pale yellow. ? Take over-the-counter or prescription medicines. ?   Eat foods that are high in fiber, such as fresh fruits and vegetables, whole grains, and beans. ? Limit foods that are high in fat and processed sugars, such as fried and sweet foods.  Keep all follow-up visits as told by your health care provider. This is important. Contact a health care provider if:  You have chills or a fever.  You have redness, swelling, or pain around an incision.  You  have fluid or blood coming from an incision.  Your incision feels warm to the touch.  You have pus or a bad smell coming from an incision.  An incision breaks open.  You feel dizzy or light-headed.  You have pain or bleeding when you urinate.  You have diarrhea, nausea, or vomiting that does not go away.  You have abnormal vaginal discharge.  You have a rash.  You have pain that does not get better with medicine. Get help right away if:  You have a fever and your symptoms suddenly get worse.  You have severe abdominal pain.  You have chest pain.  You have shortness of breath.  You faint.  You have pain, swelling, or redness on your leg.  You have heavy vaginal bleeding with blood clots. Summary  After the procedure it is common to have abdominal pain. Your provider will give you medication for this.  Do not take baths, swim, or use a hot tub until your health care provider approves.  Do not lift anything that is heavier than 10 lbs (4.5 kg) for one month after surgery, or as long as told by your health care provider.  Notify your provider if you have any signs or symptoms of infection after the procedure. This information is not intended to replace advice given to you by your health care provider. Make sure you discuss any questions you have with your health care provider. Document Revised: 04/30/2017 Document Reviewed: 07/29/2016 Elsevier Patient Education  2020 [employer]  

## 2020-02-19 NOTE — Progress Notes (Signed)
PRE-OPERATIVE HISTORY AND PHYSICAL EXAM  HPI:  Hailey Sheppard is a 54 y.o. G0P0000 Patient's last menstrual period was 01/21/2018 (exact date).; she is being admitted for surgery related to fibroids and pelvic pain.   She complains of pain mostly in the LLQ for the last several days to weeks; has had pain off and on for a while.  No radiation.  No modifiers.  Associated- mild nausea.  No VB.  Reports hot flashes and no periods in 2 years.  Prior h/o finroids; s/p hysteroscopy 1.5 years ago.  Carrier of Lynch, awaiting Covid to pass for consideration of prophylactic hysterectomy.  Recent US The uterus is anteverted and measures 6.6 x 4.5 x 3.4 cm. Echo texture is heterogenous with evidence of focal masses. Within the uterus are multiple suspected fibroids measuring: Fibroid 1: 12.3 x 7.2 x 11.9 mm intramural fundal Fibroid 2: 21.6 x 14.9 x 18.3 mm  Pedunculated right  The Endometrium measures 2.8 mm. Only partially visible due to a complex submucosal mass measuring 19.8 x 13.4 x 21.3 mm. A small amount of blood flow is seen within.   Right Ovary measures 2.3 x 1.6 x 1.5 cm. It is normal in appearance. Left Ovary measures 2.7 x 1.5 x 1.2 cm. It is normal in appearance. Questionably visualized.  Survey of the adnexa demonstrates no adnexal masses. There is no free fluid in the cul de sac.  PMHx: Past Medical History:  Diagnosis Date  . Allergy   . Anxiety   . Arthritis    bil knees  . Complication of anesthesia    pt panics easily feeling like she cant breathe  . Family history of ovarian cancer   . GERD (gastroesophageal reflux disease)   . Headache    occ-migraines  . Hypertension    PCP prescribed pt bp med but pt never took it  . PMS2-related Lynch syndrome (HNPCC4) 07/2018   MyRisk testing positive for PMS2 mutation   Past Surgical History:  Procedure Laterality Date  . APPENDECTOMY  1980   Open with Bowel Resection due to Perforation  . CHOLECYSTECTOMY N/A 06/17/2016     Procedure: LAPAROSCOPIC CHOLECYSTECTOMY;  Surgeon: Florene Glen, MD;  Location: [facility] ORS;  Service: General;  Laterality: N/A;  . COLON SURGERY  1980   Bowel Resection during Appendectomy secondary to Perforation  . DILATATION & CURETTAGE/HYSTEROSCOPY WITH TRUECLEAR N/A 07/07/2018   Procedure: DILATATION & CURETTAGE/HYSTEROSCOPY WITH TRUCLEAR;  Surgeon: Gae Dry, MD;  Location: [facility] ORS;  Service: Gynecology;  Laterality: N/A;  . GALLBLADDER SURGERY     januray 17th 2018   Family History  Problem Relation Age of Onset  . Diabetes Mother   . Obesity Mother   . Diabetes Father   . Gallbladder disease Brother   . Ovarian cancer Maternal Grandmother 61  . Heart disease Maternal Grandfather   . Gallbladder disease Maternal Grandfather   . Stroke Paternal Grandmother   . Kidney cancer Paternal Grandmother    Social History   Tobacco Use  . Smoking status: Never Smoker  . Smokeless tobacco: Never Used  Vaping Use  . Vaping Use: Never used  Substance Use Topics  . Alcohol use: Yes    Comment: rare  . Drug use: No    Current Outpatient Medications:  .  acetaminophen (TYLENOL) 500 MG tablet, Take 500 mg by mouth every 6 (six) hours as needed for mild pain., Disp: , Rfl:  .  cholecalciferol (VITAMIN D) 1000 units  tablet, Take 1 tablet (1,000 Units total) by mouth daily., Disp: , Rfl:  .  esomeprazole (NEXIUM) 20 MG capsule, Take 20 mg by mouth at bedtime. , Disp: , Rfl:  .  ibuprofen (ADVIL,MOTRIN) 200 MG tablet, Take 200 mg by mouth every 6 (six) hours as needed for mild pain., Disp: , Rfl:  .  loratadine (CLARITIN) 10 MG tablet, Take 10 mg by mouth daily as needed for allergies., Disp: , Rfl:  .  Misc Natural Products (DIETERS HERBAL COMBINATION PO), Take 2 capsules by mouth 2 (two) times daily. True Vision, Disp: , Rfl:  .  polyethylene glycol powder (GLYCOLAX/MIRALAX) powder, Take 17 g by mouth daily as needed. , Disp: , Rfl:  .  pseudoephedrine-acetaminophen (TYLENOL  SINUS) 30-500 MG TABS tablet, Take 2-3 tablets by mouth daily. , Disp: , Rfl:  .  tetrahydrozoline-zinc (VISINE-AC) 0.05-0.25 % ophthalmic solution, Place 2 drops into both eyes 3 (three) times daily as needed., Disp: , Rfl:  .  Triamcinolone Acetonide (NASACORT AQ NA), Place 1 spray into the nose daily., Disp: , Rfl:  Allergies: Erythromycin  Review of Systems  Constitutional: Negative for chills, fever and malaise/fatigue.  HENT: Negative for congestion, sinus pain and sore throat.   Eyes: Negative for blurred vision and pain.  Respiratory: Negative for cough and wheezing.   Cardiovascular: Negative for chest pain and leg swelling.  Gastrointestinal: Negative for abdominal pain, constipation, diarrhea, heartburn, nausea and vomiting.  Genitourinary: Negative for dysuria, frequency, hematuria and urgency.  Musculoskeletal: Negative for back pain, joint pain, myalgias and neck pain.  Skin: Negative for itching and rash.  Neurological: Negative for dizziness, tremors and weakness.  Endo/Heme/Allergies: Does not bruise/bleed easily.  Psychiatric/Behavioral: Negative for depression. The patient is not nervous/anxious and does not have insomnia.     Objective: BP 120/80   Ht '5\' 2"'  (1.575 m)   Wt 193 lb (87.5 kg)   LMP 01/21/2018 (Exact Date)   BMI 35.30 kg/m   Filed Weights   02/19/20 1618  Weight: 193 lb (87.5 kg)   Physical Exam Constitutional:      General: She is not in acute distress.    Appearance: She is well-developed.  HENT:     Head: Normocephalic and atraumatic. No laceration.     Right Ear: Hearing normal.     Left Ear: Hearing normal.     Mouth/Throat:     Pharynx: Uvula midline.  Eyes:     Pupils: Pupils are equal, round, and reactive to light.  Neck:     Thyroid: No thyromegaly.  Cardiovascular:     Rate and Rhythm: Normal rate and regular rhythm.     Heart sounds: No murmur heard.  No friction rub. No gallop.   Pulmonary:     Effort: Pulmonary effort is  normal. No respiratory distress.     Breath sounds: Normal breath sounds. No wheezing.  Chest:     Breasts:        Right: No mass, skin change or tenderness.        Left: No mass, skin change or tenderness.  Abdominal:     General: Bowel sounds are normal. There is no distension.     Palpations: Abdomen is soft.     Tenderness: There is no abdominal tenderness. There is no rebound.  Musculoskeletal:        General: Normal range of motion.     Cervical back: Normal range of motion and neck supple.  Neurological:  Mental Status: She is alert and oriented to person, place, and time.     Cranial Nerves: No cranial nerve deficit.  Skin:    General: Skin is warm and dry.  Psychiatric:        Judgment: Judgment normal.  Vitals reviewed.     Assessment: 1. Fibroids   2. LLQ pain   3. Carrier of gene mutation for high risk of cancer   Plan TLH BSO Pros and cons and alternatives discusssed  I have had a careful discussion with this patient about all the options available and the risk/benefits of each. I have fully informed this patient that surgery may subject her to a variety of discomforts and risks: She understands that most patients have surgery with little difficulty, but problems can happen ranging from minor to fatal. These include nausea, vomiting, pain, bleeding, infection, poor healing, hernia, or formation of adhesions. Unexpected reactions may occur from any drug or anesthetic given. Unintended injury may occur to other pelvic or abdominal structures such as Fallopian tubes, ovaries, bladder, ureter (tube from kidney to bladder), or bowel. Nerves going from the pelvis to the legs may be injured. Any such injury may require immediate or later additional surgery to correct the problem. Excessive blood loss requiring transfusion is very unlikely but possible. Dangerous blood clots may form in the legs or lungs. Physical and sexual activity will be restricted in varying degrees for an  indeterminate period of time but most often 2-6 weeks.  Finally, she understands that it is impossible to list every possible undesirable effect and that the condition for which surgery is done is not always cured or significantly improved, and in rare cases may be even worse.Ample time was given to answer all questions.  Barnett Applebaum, MD, [facility], [facility] 02/19/2020  4:40 PM

## 2020-02-19 NOTE — H&P (View-Only) (Signed)
PRE-OPERATIVE HISTORY AND PHYSICAL EXAM  HPI:  Hailey Sheppard is a 54 y.o. G0P0000 Patient's last menstrual period was 01/21/2018 (exact date).; she is being admitted for surgery related to fibroids and pelvic pain.   She complains of pain mostly in the LLQ for the last several days to weeks; has had pain off and on for a while.  No radiation.  No modifiers.  Associated- mild nausea.  No VB.  Reports hot flashes and no periods in 2 years.  Prior h/o finroids; s/p hysteroscopy 1.5 years ago.  Carrier of Lynch, awaiting Covid to pass for consideration of prophylactic hysterectomy.  Recent US The uterus is anteverted and measures 6.6 x 4.5 x 3.4 cm. Echo texture is heterogenous with evidence of focal masses. Within the uterus are multiple suspected fibroids measuring: Fibroid 1: 12.3 x 7.2 x 11.9 mm intramural fundal Fibroid 2: 21.6 x 14.9 x 18.3 mm  Pedunculated right  The Endometrium measures 2.8 mm. Only partially visible due to a complex submucosal mass measuring 19.8 x 13.4 x 21.3 mm. A small amount of blood flow is seen within.   Right Ovary measures 2.3 x 1.6 x 1.5 cm. It is normal in appearance. Left Ovary measures 2.7 x 1.5 x 1.2 cm. It is normal in appearance. Questionably visualized.  Survey of the adnexa demonstrates no adnexal masses. There is no free fluid in the cul de sac.  PMHx: Past Medical History:  Diagnosis Date  . Allergy   . Anxiety   . Arthritis    bil knees  . Complication of anesthesia    pt panics easily feeling like she cant breathe  . Family history of ovarian cancer   . GERD (gastroesophageal reflux disease)   . Headache    occ-migraines  . Hypertension    PCP prescribed pt bp med but pt never took it  . PMS2-related Lynch syndrome (HNPCC4) 07/2018   MyRisk testing positive for PMS2 mutation   Past Surgical History:  Procedure Laterality Date  . APPENDECTOMY  1980   Open with Bowel Resection due to Perforation  . CHOLECYSTECTOMY N/A 06/17/2016     Procedure: LAPAROSCOPIC CHOLECYSTECTOMY;  Surgeon: Florene Glen, MD;  Location: [facility] ORS;  Service: General;  Laterality: N/A;  . COLON SURGERY  1980   Bowel Resection during Appendectomy secondary to Perforation  . DILATATION & CURETTAGE/HYSTEROSCOPY WITH TRUECLEAR N/A 07/07/2018   Procedure: DILATATION & CURETTAGE/HYSTEROSCOPY WITH TRUCLEAR;  Surgeon: Gae Dry, MD;  Location: [facility] ORS;  Service: Gynecology;  Laterality: N/A;  . GALLBLADDER SURGERY     januray 17th 2018   Family History  Problem Relation Age of Onset  . Diabetes Mother   . Obesity Mother   . Diabetes Father   . Gallbladder disease Brother   . Ovarian cancer Maternal Grandmother 47  . Heart disease Maternal Grandfather   . Gallbladder disease Maternal Grandfather   . Stroke Paternal Grandmother   . Kidney cancer Paternal Grandmother    Social History   Tobacco Use  . Smoking status: Never Smoker  . Smokeless tobacco: Never Used  Vaping Use  . Vaping Use: Never used  Substance Use Topics  . Alcohol use: Yes    Comment: rare  . Drug use: No    Current Outpatient Medications:  .  acetaminophen (TYLENOL) 500 MG tablet, Take 500 mg by mouth every 6 (six) hours as needed for mild pain., Disp: , Rfl:  .  cholecalciferol (VITAMIN D) 1000 units  tablet, Take 1 tablet (1,000 Units total) by mouth daily., Disp: , Rfl:  .  esomeprazole (NEXIUM) 20 MG capsule, Take 20 mg by mouth at bedtime. , Disp: , Rfl:  .  ibuprofen (ADVIL,MOTRIN) 200 MG tablet, Take 200 mg by mouth every 6 (six) hours as needed for mild pain., Disp: , Rfl:  .  loratadine (CLARITIN) 10 MG tablet, Take 10 mg by mouth daily as needed for allergies., Disp: , Rfl:  .  Misc Natural Products (DIETERS HERBAL COMBINATION PO), Take 2 capsules by mouth 2 (two) times daily. [employer], Disp: , Rfl:  .  polyethylene glycol powder (GLYCOLAX/MIRALAX) powder, Take 17 g by mouth daily as needed. , Disp: , Rfl:  .  pseudoephedrine-acetaminophen (TYLENOL  SINUS) 30-500 MG TABS tablet, Take 2-3 tablets by mouth daily. , Disp: , Rfl:  .  tetrahydrozoline-zinc (VISINE-AC) 0.05-0.25 % ophthalmic solution, Place 2 drops into both eyes 3 (three) times daily as needed., Disp: , Rfl:  .  Triamcinolone Acetonide (NASACORT AQ NA), Place 1 spray into the nose daily., Disp: , Rfl:  Allergies: Erythromycin  Review of Systems  Constitutional: Negative for chills, fever and malaise/fatigue.  HENT: Negative for congestion, sinus pain and sore throat.   Eyes: Negative for blurred vision and pain.  Respiratory: Negative for cough and wheezing.   Cardiovascular: Negative for chest pain and leg swelling.  Gastrointestinal: Negative for abdominal pain, constipation, diarrhea, heartburn, nausea and vomiting.  Genitourinary: Negative for dysuria, frequency, hematuria and urgency.  Musculoskeletal: Negative for back pain, joint pain, myalgias and neck pain.  Skin: Negative for itching and rash.  Neurological: Negative for dizziness, tremors and weakness.  Endo/Heme/Allergies: Does not bruise/bleed easily.  Psychiatric/Behavioral: Negative for depression. The patient is not nervous/anxious and does not have insomnia.     Objective: BP 120/80   Ht '5\' 2"'  (1.575 m)   Wt 193 lb (87.5 kg)   LMP 01/21/2018 (Exact Date)   BMI 35.30 kg/m   Filed Weights   02/19/20 1618  Weight: 193 lb (87.5 kg)   Physical Exam Constitutional:      General: She is not in acute distress.    Appearance: She is well-developed.  HENT:     Head: Normocephalic and atraumatic. No laceration.     Right Ear: Hearing normal.     Left Ear: Hearing normal.     Mouth/Throat:     Pharynx: Uvula midline.  Eyes:     Pupils: Pupils are equal, round, and reactive to light.  Neck:     Thyroid: No thyromegaly.  Cardiovascular:     Rate and Rhythm: Normal rate and regular rhythm.     Heart sounds: No murmur heard.  No friction rub. No gallop.   Pulmonary:     Effort: Pulmonary effort is  normal. No respiratory distress.     Breath sounds: Normal breath sounds. No wheezing.  Chest:     Breasts:        Right: No mass, skin change or tenderness.        Left: No mass, skin change or tenderness.  Abdominal:     General: Bowel sounds are normal. There is no distension.     Palpations: Abdomen is soft.     Tenderness: There is no abdominal tenderness. There is no rebound.  Musculoskeletal:        General: Normal range of motion.     Cervical back: Normal range of motion and neck supple.  Neurological:  Mental Status: She is alert and oriented to person, place, and time.     Cranial Nerves: No cranial nerve deficit.  Skin:    General: Skin is warm and dry.  Psychiatric:        Judgment: Judgment normal.  Vitals reviewed.     Assessment: 1. Fibroids   2. LLQ pain   3. Carrier of gene mutation for high risk of cancer   Plan TLH BSO Pros and cons and alternatives discusssed  I have had a careful discussion with this patient about all the options available and the risk/benefits of each. I have fully informed this patient that surgery may subject her to a variety of discomforts and risks: She understands that most patients have surgery with little difficulty, but problems can happen ranging from minor to fatal. These include nausea, vomiting, pain, bleeding, infection, poor healing, hernia, or formation of adhesions. Unexpected reactions may occur from any drug or anesthetic given. Unintended injury may occur to other pelvic or abdominal structures such as Fallopian tubes, ovaries, bladder, ureter (tube from kidney to bladder), or bowel. Nerves going from the pelvis to the legs may be injured. Any such injury may require immediate or later additional surgery to correct the problem. Excessive blood loss requiring transfusion is very unlikely but possible. Dangerous blood clots may form in the legs or lungs. Physical and sexual activity will be restricted in varying degrees for an  indeterminate period of time but most often 2-6 weeks.  Finally, she understands that it is impossible to list every possible undesirable effect and that the condition for which surgery is done is not always cured or significantly improved, and in rare cases may be even worse.Ample time was given to answer all questions.  Barnett Applebaum, MD, [facility], [facility] 02/19/2020  4:40 PM

## 2020-02-26 ENCOUNTER — Encounter
Admission: RE | Admit: 2020-02-26 | Discharge: 2020-02-26 | Disposition: A | Discharge status: Home / Self Care | Payer: BC Managed Care – PPO | Source: Ambulatory Visit | Attending: Obstetrics & Gynecology | Admitting: Obstetrics & Gynecology

## 2020-02-26 ENCOUNTER — Other Ambulatory Visit: Payer: Self-pay

## 2020-02-26 NOTE — Patient Instructions (Signed)
Your procedure is scheduled on: Thursday February 29, 2020. Report to Day Surgery inside [place] 2nd floor. To find out your arrival time please call [phone] between 1PM - 3PM on Wednesday February 28, 2020.  Remember: Instructions that are not followed completely may result in serious medical risk,  up to and including death, or upon the discretion of your surgeon and anesthesiologist your  surgery may need to be rescheduled.     _X__ 1. Do not eat food after midnight the night before your procedure.                 No chewing gum or hard candies. You may drink clear liquids up to 2 hours                 before you are scheduled to arrive for your surgery- DO not drink clear                 liquids within 2 hours of the start of your surgery.                 Clear Liquids include:  water, apple juice without pulp, clear Gatorade, G2 or                  Gatorade Zero (avoid Red/Purple/Blue), Black Coffee or Tea (Do not add                 anything to coffee or tea).  __X__2.  On the morning of surgery brush your teeth with toothpaste and water, you                may rinse your mouth with mouthwash if you wish.  Do not swallow any toothpaste of mouthwash.     _X__ 3.  No Alcohol for 24 hours before or after surgery.   _X__ 4.  Do Not Smoke or use e-cigarettes For 24 Hours Prior to Your Surgery.                 Do not use any chewable tobacco products for at least 6 hours prior to                 Surgery.  _X__  5.  Do not use any recreational drugs (marijuana, cocaine, heroin, ecstasy, MDMA or other)                For at least one week prior to your surgery.  Combination of these drugs with anesthesia                May have life threatening results.  __x__ 6.  Notify your doctor if there is any change in your medical condition      (cold, fever, infections).     Do not wear jewelry, make-up, hairpins, clips or nail polish. Do not wear lotions,  powders, or perfumes. You may wear deodorant. Do not shave 48 hours prior to surgery. Men may shave face and neck. Do not bring valuables to the hospital.    [facility] is not responsible for any belongings or valuables.  Contacts, dentures or bridgework may not be worn into surgery. Leave your suitcase in the car. After surgery it may be brought to your room. For patients admitted to the hospital, discharge time is determined by your treatment team.   Patients discharged the day of surgery will not be allowed to drive home.   Make arrangements for someone to be  with you for the first 24 hours of your Same Day Discharge.   __x__ Take these medicines the morning of surgery with A SIP OF WATER:    1. esomeprazole (NEXIUM) 20 MG capsule  __x__ Use CHG Soap (or wipes) as directed  __x__ Stop Anti-inflammatories such as Ibuprofen, Aleve, Advil, naproxen, aspirin, and or BC powders.    __x__ Stop supplements until after surgery.    __x__ Do not start any herbal supplements before your procedure.    If you have any questions regarding your pre-procedure instructions,  Please call Pre-admit Testing at [phone].

## 2020-02-27 ENCOUNTER — Other Ambulatory Visit
Admission: RE | Admit: 2020-02-27 | Discharge: 2020-02-27 | Disposition: A | Discharge status: Home / Self Care | Payer: BC Managed Care – PPO | Source: Ambulatory Visit | Attending: Obstetrics & Gynecology | Admitting: Obstetrics & Gynecology

## 2020-02-27 DIAGNOSIS — Z791 Long term (current) use of non-steroidal anti-inflammatories (NSAID): Secondary | ICD-10-CM | POA: Diagnosis not present

## 2020-02-27 DIAGNOSIS — F419 Anxiety disorder, unspecified: Secondary | ICD-10-CM | POA: Diagnosis not present

## 2020-02-27 DIAGNOSIS — Z8249 Family history of ischemic heart disease and other diseases of the circulatory system: Secondary | ICD-10-CM | POA: Diagnosis not present

## 2020-02-27 DIAGNOSIS — Z881 Allergy status to other antibiotic agents status: Secondary | ICD-10-CM | POA: Diagnosis not present

## 2020-02-27 DIAGNOSIS — Z20822 Contact with and (suspected) exposure to covid-19: Secondary | ICD-10-CM | POA: Diagnosis not present

## 2020-02-27 DIAGNOSIS — R1032 Left lower quadrant pain: Secondary | ICD-10-CM | POA: Diagnosis not present

## 2020-02-27 DIAGNOSIS — Z01812 Encounter for preprocedural laboratory examination: Secondary | ICD-10-CM | POA: Diagnosis not present

## 2020-02-27 DIAGNOSIS — Z8051 Family history of malignant neoplasm of kidney: Secondary | ICD-10-CM | POA: Diagnosis not present

## 2020-02-27 DIAGNOSIS — N800 Endometriosis of uterus: Secondary | ICD-10-CM | POA: Diagnosis not present

## 2020-02-27 DIAGNOSIS — M1990 Unspecified osteoarthritis, unspecified site: Secondary | ICD-10-CM | POA: Diagnosis not present

## 2020-02-27 DIAGNOSIS — D251 Intramural leiomyoma of uterus: Secondary | ICD-10-CM | POA: Diagnosis not present

## 2020-02-27 DIAGNOSIS — Z823 Family history of stroke: Secondary | ICD-10-CM | POA: Diagnosis not present

## 2020-02-27 DIAGNOSIS — Z8349 Family history of other endocrine, nutritional and metabolic diseases: Secondary | ICD-10-CM | POA: Diagnosis not present

## 2020-02-27 DIAGNOSIS — Z9049 Acquired absence of other specified parts of digestive tract: Secondary | ICD-10-CM | POA: Diagnosis not present

## 2020-02-27 DIAGNOSIS — Z1509 Genetic susceptibility to other malignant neoplasm: Secondary | ICD-10-CM | POA: Diagnosis not present

## 2020-02-27 DIAGNOSIS — G43909 Migraine, unspecified, not intractable, without status migrainosus: Secondary | ICD-10-CM | POA: Diagnosis not present

## 2020-02-27 DIAGNOSIS — Z833 Family history of diabetes mellitus: Secondary | ICD-10-CM | POA: Diagnosis not present

## 2020-02-27 DIAGNOSIS — K660 Peritoneal adhesions (postprocedural) (postinfection): Secondary | ICD-10-CM | POA: Diagnosis not present

## 2020-02-27 DIAGNOSIS — Z148 Genetic carrier of other disease: Secondary | ICD-10-CM | POA: Diagnosis not present

## 2020-02-27 DIAGNOSIS — K219 Gastro-esophageal reflux disease without esophagitis: Secondary | ICD-10-CM | POA: Diagnosis not present

## 2020-02-27 DIAGNOSIS — I10 Essential (primary) hypertension: Secondary | ICD-10-CM | POA: Diagnosis not present

## 2020-02-27 DIAGNOSIS — Z8041 Family history of malignant neoplasm of ovary: Secondary | ICD-10-CM | POA: Diagnosis not present

## 2020-02-27 LAB — CBC
HCT: 40.3 % (ref 36.0–46.0)
Hemoglobin: 13.7 g/dL (ref 12.0–15.0)
MCH: 30 pg (ref 26.0–34.0)
MCHC: 34 g/dL (ref 30.0–36.0)
MCV: 88.2 fL (ref 80.0–100.0)
Platelets: 204 10*3/uL (ref 150–400)
RBC: 4.57 MIL/uL (ref 3.87–5.11)
RDW: 13 % (ref 11.5–15.5)
WBC: 6.4 10*3/uL (ref 4.0–10.5)
nRBC: 0 % (ref 0.0–0.2)

## 2020-02-27 LAB — TYPE AND SCREEN
ABO/RH(D): O POS
Antibody Screen: NEGATIVE

## 2020-02-27 LAB — SARS CORONAVIRUS 2 (TAT 6-24 HRS): SARS Coronavirus 2: NEGATIVE

## 2020-02-29 ENCOUNTER — Encounter
Admission: RE | Disposition: A | Discharge status: Home / Self Care | Payer: Self-pay | Source: Home / Self Care | Attending: Obstetrics & Gynecology

## 2020-02-29 ENCOUNTER — Ambulatory Visit: Payer: BC Managed Care – PPO | Admitting: Certified Registered"

## 2020-02-29 ENCOUNTER — Encounter: Payer: Self-pay | Admitting: Obstetrics & Gynecology

## 2020-02-29 ENCOUNTER — Ambulatory Visit
Admission: RE | Admit: 2020-02-29 | Discharge: 2020-02-29 | Disposition: A | Discharge status: Home / Self Care | Payer: BC Managed Care – PPO | Attending: Obstetrics & Gynecology | Admitting: Obstetrics & Gynecology

## 2020-02-29 ENCOUNTER — Other Ambulatory Visit: Payer: Self-pay

## 2020-02-29 DIAGNOSIS — N800 Endometriosis of uterus: Secondary | ICD-10-CM | POA: Diagnosis not present

## 2020-02-29 DIAGNOSIS — M1990 Unspecified osteoarthritis, unspecified site: Secondary | ICD-10-CM | POA: Insufficient documentation

## 2020-02-29 DIAGNOSIS — R1032 Left lower quadrant pain: Secondary | ICD-10-CM | POA: Insufficient documentation

## 2020-02-29 DIAGNOSIS — Z791 Long term (current) use of non-steroidal anti-inflammatories (NSAID): Secondary | ICD-10-CM | POA: Diagnosis not present

## 2020-02-29 DIAGNOSIS — I10 Essential (primary) hypertension: Secondary | ICD-10-CM | POA: Diagnosis not present

## 2020-02-29 DIAGNOSIS — Z8041 Family history of malignant neoplasm of ovary: Secondary | ICD-10-CM | POA: Diagnosis not present

## 2020-02-29 DIAGNOSIS — D259 Leiomyoma of uterus, unspecified: Secondary | ICD-10-CM | POA: Diagnosis not present

## 2020-02-29 DIAGNOSIS — K660 Peritoneal adhesions (postprocedural) (postinfection): Secondary | ICD-10-CM | POA: Insufficient documentation

## 2020-02-29 DIAGNOSIS — D252 Subserosal leiomyoma of uterus: Secondary | ICD-10-CM | POA: Diagnosis not present

## 2020-02-29 DIAGNOSIS — R102 Pelvic and perineal pain: Secondary | ICD-10-CM | POA: Diagnosis not present

## 2020-02-29 DIAGNOSIS — Z833 Family history of diabetes mellitus: Secondary | ICD-10-CM | POA: Insufficient documentation

## 2020-02-29 DIAGNOSIS — D219 Benign neoplasm of connective and other soft tissue, unspecified: Secondary | ICD-10-CM | POA: Diagnosis not present

## 2020-02-29 DIAGNOSIS — Z8249 Family history of ischemic heart disease and other diseases of the circulatory system: Secondary | ICD-10-CM | POA: Diagnosis not present

## 2020-02-29 DIAGNOSIS — D251 Intramural leiomyoma of uterus: Secondary | ICD-10-CM | POA: Diagnosis not present

## 2020-02-29 DIAGNOSIS — Z8051 Family history of malignant neoplasm of kidney: Secondary | ICD-10-CM | POA: Insufficient documentation

## 2020-02-29 DIAGNOSIS — K219 Gastro-esophageal reflux disease without esophagitis: Secondary | ICD-10-CM | POA: Diagnosis not present

## 2020-02-29 DIAGNOSIS — Z1509 Genetic susceptibility to other malignant neoplasm: Secondary | ICD-10-CM

## 2020-02-29 DIAGNOSIS — Z148 Genetic carrier of other disease: Secondary | ICD-10-CM | POA: Diagnosis not present

## 2020-02-29 DIAGNOSIS — Z881 Allergy status to other antibiotic agents status: Secondary | ICD-10-CM | POA: Insufficient documentation

## 2020-02-29 DIAGNOSIS — Z8349 Family history of other endocrine, nutritional and metabolic diseases: Secondary | ICD-10-CM | POA: Diagnosis not present

## 2020-02-29 DIAGNOSIS — G43909 Migraine, unspecified, not intractable, without status migrainosus: Secondary | ICD-10-CM | POA: Diagnosis not present

## 2020-02-29 DIAGNOSIS — Z823 Family history of stroke: Secondary | ICD-10-CM | POA: Insufficient documentation

## 2020-02-29 DIAGNOSIS — Z20822 Contact with and (suspected) exposure to covid-19: Secondary | ICD-10-CM | POA: Insufficient documentation

## 2020-02-29 DIAGNOSIS — F419 Anxiety disorder, unspecified: Secondary | ICD-10-CM | POA: Diagnosis not present

## 2020-02-29 DIAGNOSIS — Z9049 Acquired absence of other specified parts of digestive tract: Secondary | ICD-10-CM | POA: Insufficient documentation

## 2020-02-29 HISTORY — PX: CYSTOSCOPY: SHX5120

## 2020-02-29 HISTORY — PX: TOTAL LAPAROSCOPIC HYSTERECTOMY WITH BILATERAL SALPINGO OOPHORECTOMY: SHX6845

## 2020-02-29 SURGERY — HYSTERECTOMY, TOTAL, LAPAROSCOPIC, WITH BILATERAL SALPINGO-OOPHORECTOMY
Anesthesia: General | Laterality: Bilateral

## 2020-02-29 MED ORDER — FENTANYL CITRATE (PF) 100 MCG/2ML IJ SOLN
INTRAMUSCULAR | Status: DC | PRN
Start: 2020-02-29 — End: 2020-02-29
  Administered 2020-02-29 (×3): 50 ug via INTRAVENOUS

## 2020-02-29 MED ORDER — MORPHINE SULFATE (PF) 2 MG/ML IV SOLN: 1.0000 mg | INTRAVENOUS | Status: DC | PRN

## 2020-02-29 MED ORDER — HYDROMORPHONE HCL 1 MG/ML IJ SOLN
INTRAMUSCULAR | Status: AC
  Filled 2020-02-29: qty 1

## 2020-02-29 MED ORDER — SUGAMMADEX SODIUM 500 MG/5ML IV SOLN
INTRAVENOUS | Status: DC | PRN
  Administered 2020-02-29: 400 mg via INTRAVENOUS

## 2020-02-29 MED ORDER — OXYCODONE-ACETAMINOPHEN 5-325 MG PO TABS: 1.0000 | ORAL_TABLET | ORAL | Status: DC | PRN

## 2020-02-29 MED ORDER — DEXAMETHASONE SODIUM PHOSPHATE 10 MG/ML IJ SOLN
INTRAMUSCULAR | Status: AC
  Filled 2020-02-29: qty 1

## 2020-02-29 MED ORDER — BUPIVACAINE HCL (PF) 0.5 % IJ SOLN
INTRAMUSCULAR | Status: DC | PRN
  Administered 2020-02-29: 12 mL

## 2020-02-29 MED ORDER — MIDAZOLAM HCL 2 MG/2ML IJ SOLN
INTRAMUSCULAR | Status: AC
  Filled 2020-02-29: qty 2

## 2020-02-29 MED ORDER — SODIUM CHLORIDE 0.9 % IV SOLN
2.0000 g | INTRAVENOUS | Status: AC
  Administered 2020-02-29: 2 g via INTRAVENOUS

## 2020-02-29 MED ORDER — OXYCODONE-ACETAMINOPHEN 5-325 MG PO TABS
ORAL_TABLET | ORAL | Status: AC
  Administered 2020-02-29: 1 via ORAL
  Filled 2020-02-29: qty 1

## 2020-02-29 MED ORDER — PROPOFOL 10 MG/ML IV BOLUS
INTRAVENOUS | Status: AC
  Filled 2020-02-29: qty 20

## 2020-02-29 MED ORDER — PROMETHAZINE HCL 25 MG/ML IJ SOLN: 6.2500 mg | INTRAMUSCULAR | Status: DC | PRN

## 2020-02-29 MED ORDER — PROPOFOL 10 MG/ML IV BOLUS
INTRAVENOUS | Status: DC | PRN
  Administered 2020-02-29: 200 mg via INTRAVENOUS

## 2020-02-29 MED ORDER — ESMOLOL HCL 100 MG/10ML IV SOLN
INTRAVENOUS | Status: DC | PRN
  Administered 2020-02-29: 10 mg via INTRAVENOUS

## 2020-02-29 MED ORDER — ROCURONIUM BROMIDE 10 MG/ML (PF) SYRINGE
PREFILLED_SYRINGE | INTRAVENOUS | Status: AC
  Filled 2020-02-29: qty 10

## 2020-02-29 MED ORDER — ACETAMINOPHEN 650 MG RE SUPP
650.0000 mg | RECTAL | Status: DC | PRN
  Filled 2020-02-29: qty 1

## 2020-02-29 MED ORDER — ONDANSETRON HCL 4 MG/2ML IJ SOLN
INTRAMUSCULAR | Status: DC | PRN
  Administered 2020-02-29 (×2): 4 mg via INTRAVENOUS

## 2020-02-29 MED ORDER — CHLORHEXIDINE GLUCONATE 0.12 % MT SOLN
15.0000 mL | Freq: Once | OROMUCOSAL | Status: AC
  Administered 2020-02-29: 15 mL via OROMUCOSAL

## 2020-02-29 MED ORDER — ACETAMINOPHEN 325 MG PO TABS: 650.0000 mg | ORAL_TABLET | ORAL | Status: DC | PRN

## 2020-02-29 MED ORDER — SUGAMMADEX SODIUM 500 MG/5ML IV SOLN
INTRAVENOUS | Status: AC
  Filled 2020-02-29: qty 5

## 2020-02-29 MED ORDER — GLYCOPYRROLATE 0.2 MG/ML IJ SOLN
INTRAMUSCULAR | Status: DC | PRN
  Administered 2020-02-29: .2 mg via INTRAVENOUS

## 2020-02-29 MED ORDER — KETOROLAC TROMETHAMINE 30 MG/ML IJ SOLN
INTRAMUSCULAR | Status: AC
  Filled 2020-02-29: qty 1

## 2020-02-29 MED ORDER — ONDANSETRON HCL 4 MG/2ML IJ SOLN
INTRAMUSCULAR | Status: AC
  Filled 2020-02-29: qty 4

## 2020-02-29 MED ORDER — PHENYLEPHRINE HCL (PRESSORS) 10 MG/ML IV SOLN
INTRAVENOUS | Status: DC | PRN
  Administered 2020-02-29: 150 ug via INTRAVENOUS
  Administered 2020-02-29 (×2): 100 ug via INTRAVENOUS

## 2020-02-29 MED ORDER — OXYCODONE-ACETAMINOPHEN 5-325 MG PO TABS
1.0000 | ORAL_TABLET | ORAL | 0 refills | Status: DC | PRN
Start: 2020-02-29 — End: 2020-04-09

## 2020-02-29 MED ORDER — BUPIVACAINE HCL (PF) 0.5 % IJ SOLN
INTRAMUSCULAR | Status: AC
  Filled 2020-02-29: qty 30

## 2020-02-29 MED ORDER — FENTANYL CITRATE (PF) 100 MCG/2ML IJ SOLN
INTRAMUSCULAR | Status: AC
  Filled 2020-02-29: qty 2

## 2020-02-29 MED ORDER — FENTANYL CITRATE (PF) 100 MCG/2ML IJ SOLN: 25.0000 ug | INTRAMUSCULAR | Status: DC | PRN

## 2020-02-29 MED ORDER — KETOROLAC TROMETHAMINE 30 MG/ML IJ SOLN
INTRAMUSCULAR | Status: DC | PRN
  Administered 2020-02-29: 30 mg via INTRAVENOUS

## 2020-02-29 MED ORDER — LACTATED RINGERS IV SOLN: INTRAVENOUS | Status: DC

## 2020-02-29 MED ORDER — LIDOCAINE HCL (CARDIAC) PF 100 MG/5ML IV SOSY
PREFILLED_SYRINGE | INTRAVENOUS | Status: DC | PRN
  Administered 2020-02-29: 100 mg via INTRAVENOUS

## 2020-02-29 MED ORDER — ACETAMINOPHEN 10 MG/ML IV SOLN
INTRAVENOUS | Status: DC | PRN
  Administered 2020-02-29: 1000 mg via INTRAVENOUS

## 2020-02-29 MED ORDER — GLYCOPYRROLATE 0.2 MG/ML IJ SOLN
INTRAMUSCULAR | Status: AC
  Filled 2020-02-29: qty 1

## 2020-02-29 MED ORDER — MIDAZOLAM HCL 2 MG/2ML IJ SOLN
INTRAMUSCULAR | Status: DC | PRN
  Administered 2020-02-29: 2 mg via INTRAVENOUS

## 2020-02-29 MED ORDER — CHLORHEXIDINE GLUCONATE 0.12 % MT SOLN
OROMUCOSAL | Status: AC
  Filled 2020-02-29: qty 15

## 2020-02-29 MED ORDER — POVIDONE-IODINE 10 % EX SWAB
2.0000 "application " | Freq: Once | CUTANEOUS | Status: AC
  Administered 2020-02-29: 2 via TOPICAL

## 2020-02-29 MED ORDER — ROCURONIUM BROMIDE 100 MG/10ML IV SOLN
INTRAVENOUS | Status: DC | PRN
  Administered 2020-02-29: 20 mg via INTRAVENOUS
  Administered 2020-02-29: 50 mg via INTRAVENOUS

## 2020-02-29 MED ORDER — DEXMEDETOMIDINE (PRECEDEX) IN NS 20 MCG/5ML (4 MCG/ML) IV SYRINGE
PREFILLED_SYRINGE | INTRAVENOUS | Status: DC | PRN
  Administered 2020-02-29: 8 ug via INTRAVENOUS
  Administered 2020-02-29: 12 ug via INTRAVENOUS

## 2020-02-29 MED ORDER — ESMOLOL HCL 100 MG/10ML IV SOLN
INTRAVENOUS | Status: AC
  Filled 2020-02-29: qty 10

## 2020-02-29 MED ORDER — ORAL CARE MOUTH RINSE: 15.0000 mL | Freq: Once | OROMUCOSAL | Status: AC

## 2020-02-29 MED ORDER — SODIUM CHLORIDE 0.9 % IV SOLN
INTRAVENOUS | Status: AC
  Filled 2020-02-29: qty 2

## 2020-02-29 MED ORDER — ACETAMINOPHEN 10 MG/ML IV SOLN
INTRAVENOUS | Status: AC
  Filled 2020-02-29: qty 100

## 2020-02-29 MED ORDER — DEXAMETHASONE SODIUM PHOSPHATE 10 MG/ML IJ SOLN
INTRAMUSCULAR | Status: DC | PRN
  Administered 2020-02-29: 10 mg via INTRAVENOUS

## 2020-02-29 SURGICAL SUPPLY — 56 items
ADH SKN CLS APL DERMABOND .7 (GAUZE/BANDAGES/DRESSINGS) ×2
APL PRP STRL LF DISP 70% ISPRP (MISCELLANEOUS) ×2
BAG DRN RND TRDRP ANRFLXCHMBR (UROLOGICAL SUPPLIES) ×2
BAG URINE DRAIN 2000ML AR STRL (UROLOGICAL SUPPLIES) ×3 IMPLANT
BLADE SURG SZ11 CARB STEEL (BLADE) ×3 IMPLANT
CANISTER SUCT 1200ML W/VALVE (MISCELLANEOUS) ×3 IMPLANT
CATH FOLEY 2WAY  5CC 16FR (CATHETERS) ×3
CATH FOLEY 2WAY 5CC 16FR (CATHETERS) ×2
CATH URTH 16FR FL 2W BLN LF (CATHETERS) ×2 IMPLANT
CHLORAPREP W/TINT 26 (MISCELLANEOUS) ×3 IMPLANT
COVER WAND RF STERILE (DRAPES) ×3 IMPLANT
DEFOGGER SCOPE WARMER CLEARIFY (MISCELLANEOUS) ×3 IMPLANT
DERMABOND ADVANCED (GAUZE/BANDAGES/DRESSINGS) ×1
DERMABOND ADVANCED .7 DNX12 (GAUZE/BANDAGES/DRESSINGS) ×2 IMPLANT
DEVICE SUTURE ENDOST 10MM (ENDOMECHANICALS) ×3 IMPLANT
DRAPE CAMERA CLOSED 9X96 (DRAPES) ×3 IMPLANT
DRSG TEGADERM 2-3/8X2-3/4 SM (GAUZE/BANDAGES/DRESSINGS) ×9 IMPLANT
GAUZE 4X4 16PLY RFD (DISPOSABLE) ×3 IMPLANT
GLOVE BIO SURGEON STRL SZ8 (GLOVE) ×18 IMPLANT
GLOVE INDICATOR 8.0 STRL GRN (GLOVE) ×18 IMPLANT
GOWN STRL REUS W/ TWL LRG LVL3 (GOWN DISPOSABLE) ×8 IMPLANT
GOWN STRL REUS W/ TWL XL LVL3 (GOWN DISPOSABLE) ×4 IMPLANT
GOWN STRL REUS W/TWL LRG LVL3 (GOWN DISPOSABLE) ×12
GOWN STRL REUS W/TWL XL LVL3 (GOWN DISPOSABLE) ×6
GRASPER SUT TROCAR 14GX15 (MISCELLANEOUS) ×3 IMPLANT
IRRIGATION STRYKERFLOW (MISCELLANEOUS) ×2 IMPLANT
IRRIGATOR STRYKERFLOW (MISCELLANEOUS) ×3
IV LACTATED RINGERS 1000ML (IV SOLUTION) ×6 IMPLANT
KIT PINK PAD W/HEAD ARE REST (MISCELLANEOUS) ×3
KIT PINK PAD W/HEAD ARM REST (MISCELLANEOUS) ×2 IMPLANT
KIT TURNOVER CYSTO (KITS) ×3 IMPLANT
LABEL OR SOLS (LABEL) ×3 IMPLANT
MANIPULATOR VCARE LG CRV RETR (MISCELLANEOUS) IMPLANT
MANIPULATOR VCARE SML CRV RETR (MISCELLANEOUS) ×3 IMPLANT
MANIPULATOR VCARE STD CRV RETR (MISCELLANEOUS) IMPLANT
NEEDLE VERESS 14GA 120MM (NEEDLE) ×3 IMPLANT
NS IRRIG 500ML POUR BTL (IV SOLUTION) ×3 IMPLANT
OCCLUDER COLPOPNEUMO (BALLOONS) ×3 IMPLANT
PACK GYN LAPAROSCOPIC (MISCELLANEOUS) ×3 IMPLANT
PAD OB MATERNITY 4.3X12.25 (PERSONAL CARE ITEMS) ×3 IMPLANT
PAD PREP 24X41 OB/GYN DISP (PERSONAL CARE ITEMS) ×3 IMPLANT
PORT ACCESS TROCAR AIRSEAL 12 (TROCAR) IMPLANT
PORT ACCESS TROCAR AIRSEAL 5M (TROCAR)
SCISSORS METZENBAUM CVD 33 (INSTRUMENTS) ×3 IMPLANT
SET CYSTO W/LG BORE CLAMP LF (SET/KITS/TRAYS/PACK) ×3 IMPLANT
SET TRI-LUMEN FLTR TB AIRSEAL (TUBING) ×3 IMPLANT
SHEARS HARMONIC ACE PLUS 36CM (ENDOMECHANICALS) ×3 IMPLANT
SLEEVE ENDOPATH XCEL 5M (ENDOMECHANICALS) ×3 IMPLANT
SPONGE GAUZE 2X2 8PLY STRL LF (GAUZE/BANDAGES/DRESSINGS) IMPLANT
SUT ENDO VLOC 180-0-8IN (SUTURE) IMPLANT
SUT VIC AB 0 CT1 36 (SUTURE) ×3 IMPLANT
SUT VIC AB 4-0 FS2 27 (SUTURE) ×3 IMPLANT
SYR 10ML LL (SYRINGE) ×3 IMPLANT
SYR 50ML LL SCALE MARK (SYRINGE) ×3 IMPLANT
TROCAR PORT AIRSEAL 8X100 (TROCAR) ×3 IMPLANT
TROCAR XCEL NON-BLD 5MMX100MML (ENDOMECHANICALS) ×3 IMPLANT

## 2020-02-29 NOTE — Anesthesia Preprocedure Evaluation (Signed)
Anesthesia Evaluation  Patient identified by MRN, date of birth, ID band Patient awake    Reviewed: Allergy & Precautions, NPO status , Patient's Chart, lab work & pertinent test results  History of Anesthesia Complications (+) history of anesthetic complications  Airway Mallampati: III  TM Distance: >3 FB Neck ROM: Full    Dental  (+) Dental Advidsory Given, Teeth Intact, Missing   Pulmonary neg pulmonary ROS, neg sleep apnea, neg COPD,    breath sounds clear to auscultation- rhonchi (-) wheezing      Cardiovascular hypertension, (-) angina(-) CAD, (-) Past MI, (-) Cardiac Stents and (-) CABG  Rhythm:Regular Rate:Normal - Systolic murmurs and - Diastolic murmurs    Neuro/Psych  Headaches, neg Seizures Anxiety    GI/Hepatic Neg liver ROS, GERD  ,  Endo/Other  neg diabetesMorbid obesity  Renal/GU negative Renal ROS     Musculoskeletal  (+) Arthritis ,   Abdominal (+) + obese,   Peds  Hematology negative hematology ROS (+)   Anesthesia Other Findings Past Medical History: No date: Allergy No date: Anxiety No date: Arthritis     Comment:  bil knees No date: Complication of anesthesia     Comment:  pt panics easily feeling like she cant breathe No date: GERD (gastroesophageal reflux disease) No date: Headache     Comment:  occ-migraines No date: Hypertension     Comment:  PCP prescribed pt bp med but pt never took it   Reproductive/Obstetrics                             Anesthesia Physical  Anesthesia Plan  ASA: III  Anesthesia Plan: General   Post-op Pain Management:    Induction: Intravenous  PONV Risk Score and Plan: 2 and Ondansetron, Dexamethasone, Midazolam and Treatment may vary due to age or medical condition  Airway Management Planned: Oral ETT  Additional Equipment:   Intra-op Plan:   Post-operative Plan:   Informed Consent: I have reviewed the patients  History and Physical, chart, labs and discussed the procedure including the risks, benefits and alternatives for the proposed anesthesia with the patient or authorized representative who has indicated his/her understanding and acceptance.     Dental advisory given  Plan Discussed with: CRNA and Anesthesiologist  Anesthesia Plan Comments:         Anesthesia Quick Evaluation

## 2020-02-29 NOTE — Op Note (Signed)
Operative Report:  PRE-OP DIAGNOSIS: Fibroids  D21.9 LLQ pain R10.32 Carrier of gene mutation for high risk cancer Z14.8   POST-OP DIAGNOSIS: Fibroids  D21.9 LLQ pain R10.32 Carrier of gene mutation for high risk cancer Z14.8   PROCEDURE: Procedure(s): TOTAL LAPAROSCOPIC HYSTERECTOMY WITH BILATERAL SALPINGO OOPHORECTOMY CYSTOSCOPY  SURGEON: Barnett Applebaum, MD, FACOG  ASSISTANT: Dr Gilman Schmidt, No other capable assistant available, in surgery requiring high level assistant.  ANESTHESIA: General endotracheal anesthesia  ESTIMATED BLOOD LOSS: 20 mL  SPECIMENS: Uterus, Tubes, Ovaries.  COMPLICATIONS: None  DISPOSITION: stable to PACU  FINDINGS: Intraabdominal adhesions were noted in RUQ, not involved with uterus or adnexa.  Small fibroids noted on uterus.  Normal appearing ovaries.  PROCEDURE:  . The patient was taken to the OR where anesthesia was administed. She was prepped and draped in the normal sterile fashion in the dorsal lithotomy position in the [place] stirrups. A time out was performed. A Graves speculum was inserted, the cervix was grasped with a single tooth tenaculum and the endometrial cavity was sounded. The cervix was progressively dilated to a size 18 [place] with [employer] dilators. A V-Care uterine manipulator was inserted in the usual fashion without incident. Gloves were changed and attention was turned to the abdomen.   . An infraumbilical transverse 84mm skin incision was made with the scalpel after local anesthesia applied to the skin. A Veress-step needle was inserted in the usual fashion and confirmed using the hanging drop technique. A pneumoperitoneum was obtained by insufflation of CO2 (opening pressure of 38mmHg) to 6mmHg. An 8 mm trocar is then placed under direct visualization with the laparoscope. A diagnostic laparoscopy was performed yielding the previously described findings. Attention was turned to the left lower quadrant where after visualization of the inferior  epigastric vessels a 63mm skin incision was made with the scalpel. A 5 mm laparoscopic port was inserted. The same procedure was repeated in the right lower quadrant with a 2mm trocar. Attention was turned to the left aspect of the uterus, where after visualization of the ureter, the round ligament was coagulated and transected using the 54mm Harmonic Scapel. The anterior and posterior leafs of the broad ligament were dissected off as the anterior one was coagulated and transected in a caudal direction towards the cuff of the uterine manipulator.  Attention was then turned to the left fallopian tube and ovary which was recognized by visualization of the fimbria. The infundibulopelvic ligament and its blood vessels were carefully coagulated and transected using the Harmonic scapel.  Attention was turned to the right aspect of the uterus where the same procedure was performed.  The vesicouterine reflection of the peritoneum was dissected with the harmonic scapel and the bladder flap was created bluntly.  The uterine vessels were coagulated and transected bilaterally using first bipolar cautery and then the harmonic scapel. A 360 degree, circumferential colpotomy was done to completely amputate the uterus with cervix and tubes. Once the specimen was amputated it was delivered through the vagina.   . The colpotomy was repaired in a simple running fashion using a delayed absorbable suture with an endo-stitch device.  Vaginal exam confirms complete closure.  The cavity was copiously irrigated. A survey of the pelvic cavity revealed adequate hemostasis and no injury to bowel, bladder, or ureter.  The umbilical incision is closed (fascia) with a 0 Vicryl suture using a fascia closure device.  The assistance of my assisting-physician was vital to resect and retract interchangably with self on each side.   Marland Kitchen  A diagnostic cystoscopy was performed using saline distension of bladder with no lesions or injuries noted.   Bilateral urine flow from each ureteral orifice is visualized.  . At this point the procedure was finalized. All the instruments were removed from the patient's body. Gas was expelled and patient is leveled.  Incisions are closed with skin adhesive.    . Patient goes to recovery room in stable condition.  All sponge, instrument, and needle counts are correct x2.      Barnett Applebaum, MD, [facility], [place] . 02/29/2020  1:54 PM

## 2020-02-29 NOTE — Interval H&P Note (Signed)
History and Physical Interval Note:  02/29/2020 11:38 AM  Hailey Sheppard  has presented today for surgery, with the diagnosis of Fibroids  D21.9 LLQ pain R10.32 Carrier of gene mutation for high risk cancer Z14.8.  The various methods of treatment have been discussed with the patient and family. After consideration of risks, benefits and other options for treatment, the patient has consented to  Procedure(s): TOTAL LAPAROSCOPIC HYSTERECTOMY WITH BILATERAL SALPINGO OOPHORECTOMY (Bilateral) as a surgical intervention.  The patient's history has been reviewed, patient examined, no change in status, stable for surgery.  I have reviewed the patient's chart and labs.  Questions were answered to the patient's satisfaction.     Hoyt Koch

## 2020-02-29 NOTE — Transfer of Care (Signed)
Immediate Anesthesia Transfer of Care Note  Patient: Hailey Sheppard  Procedure(s) Performed: TOTAL LAPAROSCOPIC HYSTERECTOMY WITH BILATERAL SALPINGO OOPHORECTOMY (Bilateral ) CYSTOSCOPY  Patient Location: PACU  Anesthesia Type:General  Level of Consciousness: awake, drowsy and patient cooperative  Airway & Oxygen Therapy: Patient Spontanous Breathing and Patient connected to face mask oxygen  Post-op Assessment: Report given to RN and Post -op Vital signs reviewed and stable  Post vital signs: Reviewed and stable  Last Vitals:  Vitals Value Taken Time  BP 125/60 02/29/20 1359  Temp 36.1 C 02/29/20 1359  Pulse 85 02/29/20 1359  Resp 16 02/29/20 1359  SpO2 92 % 02/29/20 1359    Last Pain:  Vitals:   02/29/20 1359  TempSrc:   PainSc: 0-No pain         Complications: No complications documented.

## 2020-02-29 NOTE — Discharge Instructions (Signed)
Total Laparoscopic Hysterectomy, Care After This sheet gives you information about how to care for yourself after your procedure. Your health care provider may also give you more specific instructions. If you have problems or questions, contact your health care provider. What can I expect after the procedure? After the procedure, it is common to have:  Pain and bruising around your incisions.  A sore throat, if a breathing tube was used during surgery.  Fatigue.  Poor appetite.  Less interest in sex. If your ovaries were also removed, it is also common to have symptoms of menopause such as hot flashes, night sweats, and lack of sleep (insomnia). Follow these instructions at home: Bathing  Do not take baths, swim, or use a hot tub until your health care provider approves. You may need to only take showers for 2-3 weeks.  Keep your bandage (dressing) dry until your health care provider says it can be removed. Incision care   Follow instructions from your health care provider about how to take care of your incisions. Make sure you: ? Wash your hands with soap and water before you change your dressing. If soap and water are not available, use hand sanitizer. ? Change your dressing as told by your health care provider. ? Leave stitches (sutures), skin glue, or adhesive strips in place. These skin closures may need to stay in place for 2 weeks or longer. If adhesive strip edges start to loosen and curl up, you may trim the loose edges. Do not remove adhesive strips completely unless your health care provider tells you to do that.  Check your incision area every day for signs of infection. Check for: ? Redness, swelling, or pain. ? Fluid or blood. ? Warmth. ? Pus or a bad smell. Activity  Get plenty of rest and sleep.  Do not lift anything that is heavier than 10 lbs (4.5 kg) for one month after surgery, or as long as told by your health care provider.  Do not drive or use heavy  machinery while taking prescription pain medicine.  Do not drive for 24 hours if you were given a medicine to help you relax (sedative).  Return to your normal activities as told by your health care provider. Ask your health care provider what activities are safe for you. Lifestyle   Do not use any products that contain nicotine or tobacco, such as cigarettes and e-cigarettes. These can delay healing. If you need help quitting, ask your health care provider.  Do not drink alcohol until your health care provider approves. General instructions  Do not douche, use tampons, or have sex for at least 6 weeks, or as told by your health care provider.  Take over-the-counter and prescription medicines only as told by your health care provider.  To monitor yourself for a fever, take your temperature at least once a day during recovery.  If you struggle with physical or emotional changes after your procedure, speak with your health care provider or a therapist.  To prevent or treat constipation while you are taking prescription pain medicine, your health care provider may recommend that you: ? Drink enough fluid to keep your urine clear or pale yellow. ? Take over-the-counter or prescription medicines. ? Eat foods that are high in fiber, such as fresh fruits and vegetables, whole grains, and beans. ? Limit foods that are high in fat and processed sugars, such as fried and sweet foods.  Keep all follow-up visits as told by your health care provider.   This is important. Contact a health care provider if:  You have chills or a fever.  You have redness, swelling, or pain around an incision.  You have fluid or blood coming from an incision.  Your incision feels warm to the touch.  You have pus or a bad smell coming from an incision.  An incision breaks open.  You feel dizzy or light-headed.  You have pain or bleeding when you urinate.  You have diarrhea, nausea, or vomiting that does not  go away.  You have abnormal vaginal discharge.  You have a rash.  You have pain that does not get better with medicine. Get help right away if:  You have a fever and your symptoms suddenly get worse.  You have severe abdominal pain.  You have chest pain.  You have shortness of breath.  You faint.  You have pain, swelling, or redness on your leg.  You have heavy vaginal bleeding with blood clots. Summary  After the procedure it is common to have abdominal pain. Your provider will give you medication for this.  Do not take baths, swim, or use a hot tub until your health care provider approves.  Do not lift anything that is heavier than 10 lbs (4.5 kg) for one month after surgery, or as long as told by your health care provider.  Notify your provider if you have any signs or symptoms of infection after the procedure. This information is not intended to replace advice given to you by your health care provider. Make sure you discuss any questions you have with your health care provider. Document Revised: 04/30/2017 Document Reviewed: 07/29/2016 Elsevier Patient Education  2020 [employer] Inc[employer]    AMBULATORY SURGERY  DISCHARGE INSTRUCTIONS   1) The drugs that you were given will stay in your system until tomorrow so for the next 24 hours you should not:  A) Drive an automobile B) Make any legal decisions C) Drink any alcoholic beverage   2) You may resume regular meals tomorrow.  Today it is better to start with liquids and gradually work up to solid foods.  You may eat anything you prefer, but it is better to start with liquids, then soup and crackers, and gradually work up to solid foods.   3) Please notify your doctor immediately if you have any unusual bleeding, trouble breathing, redness and pain at the surgery site, drainage, fever, or pain not relieved by medication.    4) Additional Instructions:        Please contact your physician with any problems  or Same Day Surgery at [phone], Monday through Friday 6 am to 4 pm, or [place] [facility] [place] [facility] number at [phone]. 

## 2020-02-29 NOTE — Anesthesia Procedure Notes (Signed)
Procedure Name: Intubation Performed by: Fletcher-Harrison, Elisa Kutner, CRNA Pre-anesthesia Checklist: Patient identified, Emergency Drugs available, Suction available and Patient being monitored Patient Re-evaluated:Patient Re-evaluated prior to induction Oxygen Delivery Method: Circle system utilized Preoxygenation: Pre-oxygenation with 100% oxygen Induction Type: IV induction Ventilation: Mask ventilation without difficulty Laryngoscope Size: McGraph and 3 Grade View: Grade I Tube type: Oral Tube size: 7.0 mm Number of attempts: 1 Airway Equipment and Method: Stylet and Oral airway Placement Confirmation: ETT inserted through vocal cords under direct vision,  positive ETCO2,  breath sounds checked- equal and bilateral and CO2 detector Secured at: 21 cm Tube secured with: Tape Dental Injury: Teeth and Oropharynx as per pre-operative assessment        

## 2020-03-01 ENCOUNTER — Telehealth: Payer: Self-pay | Admitting: Obstetrics & Gynecology

## 2020-03-01 LAB — SURGICAL PATHOLOGY

## 2020-03-01 NOTE — Telephone Encounter (Signed)
Adv pt it's part of the healing process.  May take e.s. tylenol or IBU.  Pt aware to be seen if temp is greater than 100.4 with the use of tylenol or IBU.

## 2020-03-01 NOTE — Telephone Encounter (Signed)
Patient is calling post op from surgery with [place] on 02/29/20 with the complaint of temp 99.8. patient wants to know if this is normal. Please advise

## 2020-03-03 NOTE — Anesthesia Postprocedure Evaluation (Signed)
Anesthesia Post Note  Patient: Hailey Sheppard  Procedure(s) Performed: TOTAL LAPAROSCOPIC HYSTERECTOMY WITH BILATERAL SALPINGO OOPHORECTOMY (Bilateral ) CYSTOSCOPY  Patient location during evaluation: PACU Anesthesia Type: General Level of consciousness: awake and alert Pain management: pain level controlled Vital Signs Assessment: post-procedure vital signs reviewed and stable Respiratory status: spontaneous breathing, nonlabored ventilation, respiratory function stable and patient connected to nasal cannula oxygen Cardiovascular status: blood pressure returned to baseline and stable Postop Assessment: no apparent nausea or vomiting Anesthetic complications: no   No complications documented.   Last Vitals:  Vitals:   02/29/20 1509 02/29/20 1532  BP: 140/78 (!) 156/87  Pulse: 66 61  Resp: 16 16  Temp: (!) 36.4 C   SpO2: 96% 98%    Last Pain:  Vitals:   02/29/20 1532  TempSrc:   PainSc: 3                  Molli Barrows

## 2020-03-11 ENCOUNTER — Ambulatory Visit (INDEPENDENT_AMBULATORY_CARE_PROVIDER_SITE_OTHER): Payer: BC Managed Care – PPO | Admitting: Obstetrics & Gynecology

## 2020-03-11 ENCOUNTER — Encounter: Payer: Self-pay | Admitting: Obstetrics & Gynecology

## 2020-03-11 ENCOUNTER — Other Ambulatory Visit: Payer: Self-pay

## 2020-03-11 VITALS — BP 130/80 | Ht 62.0 in | Wt 210.0 lb

## 2020-03-11 DIAGNOSIS — Z90710 Acquired absence of both cervix and uterus: Secondary | ICD-10-CM

## 2020-03-11 NOTE — Progress Notes (Signed)
  Postoperative Follow-up Patient presents post op from [facility] BSO for fibroids, 2 weeks ago.  Also for Lynch Syndrome carrier status  Path: DIAGNOSIS:  A. UTERUS WITH CERVIX, BILATERAL FALLOPIAN TUBES AND OVARIES; TOTAL  HYSTERECTOMY WITH BILATERAL SALPINGO-OOPHORECTOMY:  - UTERINE CERVIX:    - BENIGN TRANSFORMATION ZONE.    - NEGATIVE FOR SQUAMOUS INTRAEPITHELIAL LESION AND MALIGNANCY.  - ENDOMETRIUM:    - DISORDERED PROLIFERATIVE ENDOMETRIUM.    - NEGATIVE FOR ATYPICAL HYPERPLASIA/EIN AND MALIGNANCY.  - MYOMETRIUM:    - LEIOMYOMATA UTERI.    - ADENOMYOSIS.    - NEGATIVE FOR FEATURES OF MALIGNANCY.  - FALLOPIAN TUBES:    - NO SIGNIFICANT HISTOPATHOLOGIC CHANGE.  - OVARIES:    - BENIGN PHYSIOLOGIC CHANGES.  Images: Media Information    Subjective: Patient reports some improvement in her preop symptoms. Eating a regular diet without difficulty. The patient is not having any pain.  Activity: normal activities of daily living. Patient reports additional symptom's since surgery of rare spotting  Objective: BP 130/80   Ht 5\' 2"  (1.575 m)   Wt 210 lb (95.3 kg)   LMP 01/21/2018 (Exact Date)   BMI 38.41 kg/m  Physical Exam Constitutional:      General: She is not in acute distress.    Appearance: She is well-developed.  Cardiovascular:     Rate and Rhythm: Normal rate.  Pulmonary:     Effort: Pulmonary effort is normal.  Abdominal:     General: There is no distension.     Palpations: Abdomen is soft.     Tenderness: There is no abdominal tenderness.     Comments: Incision Healing Well   Musculoskeletal:        General: Normal range of motion.  Neurological:     Mental Status: She is alert and oriented to person, place, and time.     Cranial Nerves: No cranial nerve deficit.  Skin:    General: Skin is warm and dry.     Assessment: s/p :  [facility] BSO progressing well  Plan: Patient has done well after surgery with no apparent complications.  I  have discussed the post-operative course to date, and the expected progress moving forward.  The patient understands what complications to be concerned about.  I will see the patient in routine follow up, or sooner if needed.    Activity plan: No restriction except Pelvic rest.  Hoyt Koch 03/11/2020, 11:38 AM

## 2020-03-19 ENCOUNTER — Other Ambulatory Visit: Payer: BC Managed Care – PPO

## 2020-03-19 ENCOUNTER — Encounter: Payer: BC Managed Care – PPO | Admitting: Obstetrics and Gynecology

## 2020-03-25 ENCOUNTER — Other Ambulatory Visit: Payer: BC Managed Care – PPO

## 2020-03-25 ENCOUNTER — Encounter: Payer: BC Managed Care – PPO | Admitting: Obstetrics & Gynecology

## 2020-04-09 ENCOUNTER — Ambulatory Visit (INDEPENDENT_AMBULATORY_CARE_PROVIDER_SITE_OTHER): Payer: BC Managed Care – PPO | Admitting: Obstetrics & Gynecology

## 2020-04-09 ENCOUNTER — Encounter: Payer: Self-pay | Admitting: Obstetrics & Gynecology

## 2020-04-09 ENCOUNTER — Telehealth: Payer: Self-pay

## 2020-04-09 ENCOUNTER — Other Ambulatory Visit: Payer: Self-pay

## 2020-04-09 VITALS — BP 130/80 | Ht 62.0 in | Wt 211.0 lb

## 2020-04-09 DIAGNOSIS — Z90710 Acquired absence of both cervix and uterus: Secondary | ICD-10-CM

## 2020-04-09 NOTE — Progress Notes (Signed)
  Postoperative Follow-up Patient presents post op from [facility] BSO for fibroids, 6 weeks ago.  Subjective: Patient reports marked improvement in her preop symptoms. Eating a regular diet without difficulty. The patient is not having any pain.  Activity: normal activities of daily living. Patient reports additional symptom's since surgery of None.  Objective: BP 130/80   Ht 5\' 2"  (1.575 m)   Wt 211 lb (95.7 kg)   LMP 01/21/2018 (Exact Date)   BMI 38.59 kg/m  Physical Exam Constitutional:      General: She is not in acute distress.    Appearance: She is well-developed.  Genitourinary:     Pelvic exam was performed with patient supine.     Vagina and rectum normal.     No vaginal erythema or bleeding.     No right or left adnexal mass present.     Right adnexa not tender.     Left adnexa not tender.     Genitourinary Comments: Cervix and uterus absent. Vaginal cuff healing well.  Cardiovascular:     Rate and Rhythm: Normal rate.  Pulmonary:     Effort: Pulmonary effort is normal.  Abdominal:     General: There is no distension.     Palpations: Abdomen is soft.     Tenderness: There is no abdominal tenderness.     Comments: Incision healing well.  Musculoskeletal:        General: Normal range of motion.  Neurological:     Mental Status: She is alert and oriented to person, place, and time.     Cranial Nerves: No cranial nerve deficit.  Skin:    General: Skin is warm and dry.     Assessment: s/p :  [facility] BSO progressing well  Plan: Patient has done well after surgery with no apparent complications.  I have discussed the post-operative course to date, and the expected progress moving forward.  The patient understands what complications to be concerned about.  I will see the patient in routine follow up, or sooner if needed.    Activity plan: No restriction.  Discussed menopause sx's and tx options. Patient with bothersome menopausal vasomotor symptoms. Discussed lifestyle  interventions such as wearing light clothing, remaining in cool environments, having fan/air conditioner in the room, avoiding hot beverages etc.  Exercise also shown to be significantly helpful in alleviating hot flashes.  Discussed using hormone therapy and concerns about increased risk of heart disease, cerebrovascular disease, thromboembolic disease,  and breast cancer.  Also discussed other medical options such as Clonidine, SSRI, or Neurontin.   Also discussed alternative therapies such as herbal remedies but cautioned that most of the products contained phytoestrogens (plant estrogens) in unregulated amounts which can have the same effects on the body as the pharmaceutical estrogen preparations.  Patient opted for [facility] therapy for now.  Needs MMG  Hoyt Koch 04/09/2020, 11:38 AM

## 2020-04-09 NOTE — Patient Instructions (Signed)
Black Cohosh, Cimicifuga racemosa oral dosage forms What is this medicine? BLACK COHOSH (blak KOH hosh) or Cimicifuga racemosa is a dietary supplement. It is promoted to relieve symptoms of menopause, such as hot flashes. The FDA has not approved this supplement for any medical use. This supplement may be used for other purposes; ask your health care provider or pharmacist if you have questions. This medicine may be used for other purposes; ask your health care provider or pharmacist if you have questions. What should I tell my health care provider before I take this medicine? They need to know if you have any of these conditions:  breast cancer  cervical, ovarian or uterine cancer  high blood pressure  infertility  liver disease  menstrual changes or irregular periods  unusual vaginal or uterine bleeding  an unusual or allergic reaction to black cohosh, soybeans, tartrazine dye (yellow dye number 5), other medicines, foods, dyes, or preservatives  pregnant or trying to get pregnant  breast-feeding How should I use this medicine? Take this herb by mouth with a glass of water. Follow the directions on the package labeling, or talk to your health care professional. Do not use for longer than 6 months without the advice of a health care professional. Do not use if you are pregnant or breast-feeding. Talk to your obstetrician-gynecologist or certified nurse-midwife. This herb is not for use in children under the age of 20 years. Overdosage: If you think you have taken too much of this medicine contact a poison control center or emergency room at once. NOTE: This medicine is only for you. Do not share this medicine with others. What if I miss a dose? If you miss a dose, take it as soon as you can. If it is almost time for your next dose, take only that dose. Do not take double or extra doses. What may interact with this medicine?  atorvastatin  cisplatin  fertility treatments This  list may not describe all possible interactions. Give your health care provider a list of all the medicines, herbs, non-prescription drugs, or dietary supplements you use. Also tell them if you smoke, drink alcohol, or use illegal drugs. Some items may interact with your medicine. What should I watch for while using this medicine? Since this herb is derived from a plant, allergic reactions are possible. Stop using this herb if you develop a rash. You may need to see your health care professional, or inform them that this occurred. Report any unusual side effects promptly. If you are taking this herb for menstrual or menopausal symptoms, visit your doctor or health care professional for regular checks on your progress. You should have a complete check-up every 6 months. You will need a regular breast and pelvic exam while on this therapy. Follow the advice of your doctor or health care professional. Women should inform their doctor if they wish to become pregnant or think they might be pregnant. If you have any reason to think you are pregnant, stop taking this herb at once and contact your doctor or health care professional. Herbal or dietary supplements are not regulated like medicines. Rigid quality control standards are not required for dietary supplements. The purity and strength of these products can vary. The safety and effect of this dietary supplement for a certain disease or illness is not well known. This product is not intended to diagnose, treat, cure or prevent any disease. The Food and Drug Administration suggests the following to help consumers protect themselves:  Always read product labels and follow directions.  Natural does not mean a product is safe for humans to take.  Look for products that include USP after the ingredient name. This means that the manufacturer followed the standards of the U.S. Pharmacopoeia.  Supplements made or sold by a nationally known food or drug company  are more likely to be made under tight controls. You can write to the company for more information about how the product was made. What side effects may I notice from receiving this medicine? Side effects that you should report to your doctor or health care professional as soon as possible:  allergic reactions like skin rash, itching or hives, swelling of the face, lips, or tongue  breathing problems  dizziness  palpitations  signs and symptoms of liver injury like dark yellow or brown urine; general ill feeling or flu-like symptoms; light-colored stools; loss of appetite; nausea; right upper belly pain; unusually weak or tired; yellowing of the eyes or skin  unusual vaginal bleeding Side effects that usually do not require medical attention (report to your doctor or health care professional if they continue or are bothersome):  breast tenderness  headache  nausea  upset stomach This list may not describe all possible side effects. Call your doctor for medical advice about side effects. You may report side effects to FDA at 1-800-FDA-1088. Where should I keep my medicine? Keep out of the reach of children. Store at room temperature between 15 and 30 degrees C (59 and 86 degrees C). Throw away any unused herb after the expiration date. NOTE: This sheet is a summary. It may not cover all possible information. If you have questions about this medicine, talk to your doctor, pharmacist, or health care provider.  2020 Elsevier/Gold Standard (2015-11-27 14:35:09)

## 2020-04-09 NOTE — Telephone Encounter (Signed)
Pt calling; needs note to return to work without restrictions.  Will p/u note.  [phone]  Called to see what day pt wanted to start back to work.  Pt desired to start back tomorrow.  Adv will write note; can come and ask receptionist for it.

## 2020-07-10 ENCOUNTER — Other Ambulatory Visit: Payer: Self-pay | Admitting: Obstetrics & Gynecology

## 2020-07-11 ENCOUNTER — Telehealth: Payer: Self-pay

## 2020-07-11 ENCOUNTER — Other Ambulatory Visit: Payer: Self-pay | Admitting: Obstetrics & Gynecology

## 2020-07-11 DIAGNOSIS — Z1231 Encounter for screening mammogram for malignant neoplasm of breast: Secondary | ICD-10-CM

## 2020-07-11 NOTE — Telephone Encounter (Signed)
-----   Message from Gae Dry, MD sent at 07/11/2020  1:53 PM EST ----- Regarding: MMG Pt still has not had MMG done.  Please call and encourage her to do so, and even offer to help schedule the MMG if she feels this would help her.  The number is [phone] to schedule at [facility].  Do not let her hang up without a plan to have this done.  It is very important to Dr Kenton Kingfisher to have this MMG done.  Thanks for the help.

## 2020-07-11 NOTE — Telephone Encounter (Signed)
Called pt again to schedule mammogram, No answer, left voice message

## 2020-07-11 NOTE — Telephone Encounter (Signed)
Left message to schedule her mamogram

## 2020-07-16 NOTE — Telephone Encounter (Signed)
Called and left message to remind her to schedule her mammo

## 2020-07-16 NOTE — Telephone Encounter (Signed)
-----   Message from Gae Dry, MD sent at 07/11/2020  1:53 PM EST ----- Regarding: MMG Pt still has not had MMG done.  Please call and encourage her to do so, and even offer to help schedule the MMG if she feels this would help her.  The number is [phone] to schedule at [facility].  Do not let her hang up without a plan to have this done.  It is very important to Dr Kenton Kingfisher to have this MMG done.  Thanks for the help.

## 2020-07-29 ENCOUNTER — Other Ambulatory Visit: Payer: Self-pay

## 2020-07-29 ENCOUNTER — Ambulatory Visit: Payer: BC Managed Care – PPO | Admitting: Family Medicine

## 2020-07-29 ENCOUNTER — Encounter: Payer: Self-pay | Admitting: Family Medicine

## 2020-07-29 ENCOUNTER — Ambulatory Visit
Admission: RE | Admit: 2020-07-29 | Discharge: 2020-07-29 | Disposition: A | Discharge status: Home / Self Care | Payer: BC Managed Care – PPO | Source: Ambulatory Visit | Attending: Family Medicine | Admitting: Family Medicine

## 2020-07-29 VITALS — BP 160/100 | HR 95 | Temp 98.5°F | Resp 16 | Ht 62.0 in | Wt 217.9 lb

## 2020-07-29 DIAGNOSIS — M25559 Pain in unspecified hip: Secondary | ICD-10-CM

## 2020-07-29 DIAGNOSIS — Z90710 Acquired absence of both cervix and uterus: Secondary | ICD-10-CM | POA: Diagnosis not present

## 2020-07-29 DIAGNOSIS — M79604 Pain in right leg: Secondary | ICD-10-CM | POA: Diagnosis not present

## 2020-07-29 DIAGNOSIS — M25551 Pain in right hip: Secondary | ICD-10-CM | POA: Diagnosis not present

## 2020-07-29 MED ORDER — MELOXICAM 15 MG PO TABS: 15.0000 mg | ORAL_TABLET | Freq: Every day | ORAL | 0 refills | Status: DC

## 2020-07-29 NOTE — Patient Instructions (Signed)
It was great to see you!  Our plans for today:  - We are getting xrays of your hips. We will release these results to your MyChart. - Take the meloxicam daily as needed for pain. Do not take ibuprofen, aleve, advil with this.  - You can continue to do your regular activities as tolerated. Any low impact movement will be helpful at staying active/losing weight without making your pain worse.   Take care and seek immediate care sooner if you develop any concerns.   Dr. Ky Barban

## 2020-07-29 NOTE — Progress Notes (Signed)
   SUBJECTIVE:   CHIEF COMPLAINT / HPI:   Patient Active Problem List   Diagnosis Date Noted  . Leg pain 07/30/2020  . S/P laparoscopic hysterectomy 03/11/2020  . Intramural and subserous leiomyoma of uterus   . Pelvic pain   . Genetic carrier of heritable cancer 08/04/2018  . Menorrhalgia 07/07/2018  . Fibroids, submucosal 07/07/2018  . Effusion of left knee joint 03/07/2018  . Degenerative arthritis of left knee 03/07/2018  . Degenerative arthritis of right knee 03/07/2018  . Preventative health care 12/16/2016  . Allergy 05/27/2016  . GERD (gastroesophageal reflux disease) 05/27/2016   LEG PAIN - h/o mild degenerative changes in b/l knee XR 2019 (nonstanding) Duration: chronic, worse recently Quality:  Sharp, aching Location:  thighs, mainly R. Also in groin. Tends to start in lower back. Bilateral:  no Onset: gradual Frequency: constant Time of day:   night time Paresthesias:   no Decreased sensation:  no Weakness:   yes Alleviating factors: Medlife pain cream Aggravating factors: sitting, standing, laying. On feet a lot at work, works in [employer] research (medical). Status: worse Treatments attempted: tylenol, ibuprofen, pain cream, aspercreme No loss of bowel or bladder control.  No fevers.    OBJECTIVE:   BP (!) 160/100   Pulse 95   Temp 98.5 F (36.9 C) (Oral)   Resp 16   Ht 5\' 2"  (1.575 m)   Wt 217 lb 14.4 oz (98.8 kg)   LMP 01/21/2018 (Exact Date)   SpO2 98%   BMI 39.85 kg/m   Gen: overweight, in NAD MSK: no midline spinal tenderness or paravertebral tenderness or hypertonicity. Slight TTP over R piriformis. 5/5 LE strength. Negative straight leg raise bilaterally. +FADIR/FABER on the R with reproduction of symptoms. No LE swelling, redness, asymmetry.  ASSESSMENT/PLAN:   Leg pain Likely MSK in origin and stemming from R hip joint given report of groin pain, worse at night and with movement as well as +FADIR/FABER. Probable OA given OA in other joints and chronic  duration, no findings or symptoms to concern for septic joint. Will obtain XR to evaluate further. Meloxicam provided for pain relief. Consider ortho referral for steroid injection pending results.    Myles Gip, DO

## 2020-07-30 DIAGNOSIS — M79606 Pain in leg, unspecified: Secondary | ICD-10-CM | POA: Insufficient documentation

## 2020-07-30 NOTE — Assessment & Plan Note (Signed)
Likely MSK in origin and stemming from R hip joint given report of groin pain, worse at night and with movement as well as +FADIR/FABER. Probable OA given OA in other joints and chronic duration, no findings or symptoms to concern for septic joint. Will obtain XR to evaluate further. Meloxicam provided for pain relief. Consider ortho referral for steroid injection pending results.

## 2020-08-06 ENCOUNTER — Other Ambulatory Visit: Payer: Self-pay | Admitting: Obstetrics & Gynecology

## 2020-08-30 ENCOUNTER — Telehealth: Payer: Self-pay

## 2020-08-30 ENCOUNTER — Other Ambulatory Visit: Payer: Self-pay | Admitting: Family Medicine

## 2020-08-30 DIAGNOSIS — M79604 Pain in right leg: Secondary | ICD-10-CM

## 2020-08-30 DIAGNOSIS — M25559 Pain in unspecified hip: Secondary | ICD-10-CM

## 2020-08-30 MED ORDER — MELOXICAM 15 MG PO TABS: 15.0000 mg | ORAL_TABLET | Freq: Every day | ORAL | 0 refills | Status: DC

## 2020-08-30 NOTE — Telephone Encounter (Signed)
Copied from [place] [id number]. Topic: Referral - Request for Referral >> Aug 30, 2020  1:25 PM Erick Blinks wrote: Has patient seen PCP for this complaint? Yes.   *If NO, is insurance requiring patient see PCP for this issue before PCP can refer them? Referral for which specialty: Orthopedic  Preferred provider/office: highest recommended - [facility]?  Reason for referral: Her hip and leg

## 2020-08-30 NOTE — Telephone Encounter (Signed)
Requested Prescriptions  Pending Prescriptions Disp Refills  . meloxicam (MOBIC) 15 MG tablet 30 tablet 0    Sig: Take 1 tablet (15 mg total) by mouth daily.     Analgesics:  COX2 Inhibitors Failed - 08/30/2020  1:33 PM      Failed - Cr in normal range and within 360 days    Creat  Date Value Ref Range Status  03/02/2018 0.83 0.50 - 1.05 mg/dL Final    Comment:    For patients >56 years of age, the reference limit for Creatinine is approximately 13% higher for people identified as African-American. .          Passed - HGB in normal range and within 360 days    Hemoglobin  Date Value Ref Range Status  02/27/2020 13.7 12.0 - 15.0 g/dL Final         Passed - Patient is not pregnant      Passed - Valid encounter within last 12 months    Recent Outpatient Visits          1 month ago Hip pain   [facility] Myles Gip, DO   7 months ago Carrier of gene mutation for high risk of cancer   [facility] Lebron Conners D, MD   2 years ago Chronic pain of both knees   [place], Satira Anis, MD   3 years ago Preventative health care   [facility] [place], Satira Anis, MD

## 2020-08-30 NOTE — Telephone Encounter (Signed)
Medication Refill - Medication: meloxicam (MOBIC) 15 MG tablet   Has the patient contacted their pharmacy? Yes.   (Agent: If no, request that the patient contact the pharmacy for the refill.) (Agent: If yes, when and what did the pharmacy advise?)  Preferred Pharmacy (with phone number or street name):  [id number] [address], [place] - [address]  [place] [address]  Phone: [phone] Fax: [phone]     Agent: Please be advised that RX refills may take up to 3 business days. We ask that you follow-up with your pharmacy.

## 2020-09-19 DIAGNOSIS — M1611 Unilateral primary osteoarthritis, right hip: Secondary | ICD-10-CM | POA: Diagnosis not present

## 2020-09-27 DIAGNOSIS — M1611 Unilateral primary osteoarthritis, right hip: Secondary | ICD-10-CM | POA: Diagnosis not present

## 2020-09-30 ENCOUNTER — Other Ambulatory Visit: Payer: Self-pay | Admitting: Family Medicine

## 2020-09-30 NOTE — Telephone Encounter (Signed)
Requested medication (s) are due for refill today: yes  Requested medication (s) are on the active medication list: yes  Last refill: 08/30/2020  Future visit scheduled: no  Notes to clinic: Medication last filled by Rory Percy   Requested Prescriptions  Pending Prescriptions Disp Refills   meloxicam (MOBIC) 15 MG tablet [Pharmacy Med Name: MELOXICAM 15 MG TABLET] 30 tablet 0    Sig: Take 1 tablet (15 mg total) by mouth daily.      Analgesics:  COX2 Inhibitors Failed - 09/30/2020  1:30 AM      Failed - Cr in normal range and within 360 days    Creat  Date Value Ref Range Status  03/02/2018 0.83 0.50 - 1.05 mg/dL Final    Comment:    For patients >38 years of age, the reference limit for Creatinine is approximately 13% higher for people identified as African-American. .           Passed - HGB in normal range and within 360 days    Hemoglobin  Date Value Ref Range Status  02/27/2020 13.7 12.0 - 15.0 g/dL Final          Passed - Patient is not pregnant      Passed - Valid encounter within last 12 months    Recent Outpatient Visits           2 months ago Hip pain   [facility] Rory Percy M, DO   8 months ago Carrier of gene mutation for high risk of cancer   [facility] Lebron Conners D, MD   2 years ago Chronic pain of both knees   [place], Satira Anis, MD   3 years ago Preventative health care   [facility] [place], Satira Anis, MD

## 2020-11-20 ENCOUNTER — Other Ambulatory Visit: Payer: Self-pay | Admitting: Family Medicine

## 2021-05-15 ENCOUNTER — Other Ambulatory Visit: Payer: Self-pay | Admitting: Obstetrics & Gynecology

## 2021-05-15 DIAGNOSIS — Z1231 Encounter for screening mammogram for malignant neoplasm of breast: Secondary | ICD-10-CM

## 2021-07-25 DIAGNOSIS — M1611 Unilateral primary osteoarthritis, right hip: Secondary | ICD-10-CM | POA: Diagnosis not present

## 2021-07-29 DIAGNOSIS — M1611 Unilateral primary osteoarthritis, right hip: Secondary | ICD-10-CM | POA: Diagnosis not present

## 2021-08-01 ENCOUNTER — Encounter: Payer: Self-pay | Admitting: Family Medicine

## 2021-08-01 DIAGNOSIS — R195 Other fecal abnormalities: Secondary | ICD-10-CM

## 2021-08-09 ENCOUNTER — Emergency Department: Payer: BC Managed Care – PPO

## 2021-08-09 ENCOUNTER — Other Ambulatory Visit: Payer: Self-pay

## 2021-08-09 ENCOUNTER — Emergency Department
Admission: EM | Admit: 2021-08-09 | Discharge: 2021-08-09 | Disposition: A | Discharge status: Home / Self Care | Payer: BC Managed Care – PPO | Attending: Emergency Medicine | Admitting: Emergency Medicine

## 2021-08-09 DIAGNOSIS — K449 Diaphragmatic hernia without obstruction or gangrene: Secondary | ICD-10-CM | POA: Diagnosis not present

## 2021-08-09 DIAGNOSIS — M160 Bilateral primary osteoarthritis of hip: Secondary | ICD-10-CM | POA: Diagnosis not present

## 2021-08-09 DIAGNOSIS — I10 Essential (primary) hypertension: Secondary | ICD-10-CM

## 2021-08-09 DIAGNOSIS — R109 Unspecified abdominal pain: Secondary | ICD-10-CM

## 2021-08-09 DIAGNOSIS — M545 Low back pain, unspecified: Secondary | ICD-10-CM | POA: Diagnosis not present

## 2021-08-09 DIAGNOSIS — K6389 Other specified diseases of intestine: Secondary | ICD-10-CM | POA: Diagnosis not present

## 2021-08-09 LAB — CBC
HCT: 40.4 % (ref 36.0–46.0)
Hemoglobin: 12.9 g/dL (ref 12.0–15.0)
MCH: 29.1 pg (ref 26.0–34.0)
MCHC: 31.9 g/dL (ref 30.0–36.0)
MCV: 91.2 fL (ref 80.0–100.0)
Platelets: 221 10*3/uL (ref 150–400)
RBC: 4.43 MIL/uL (ref 3.87–5.11)
RDW: 13.2 % (ref 11.5–15.5)
WBC: 8.8 10*3/uL (ref 4.0–10.5)
nRBC: 0 % (ref 0.0–0.2)

## 2021-08-09 LAB — URINALYSIS, COMPLETE (UACMP) WITH MICROSCOPIC
Bacteria, UA: NONE SEEN
Bilirubin Urine: NEGATIVE
Glucose, UA: NEGATIVE mg/dL
Hgb urine dipstick: NEGATIVE
Ketones, ur: NEGATIVE mg/dL
Leukocytes,Ua: NEGATIVE
Nitrite: NEGATIVE
Protein, ur: NEGATIVE mg/dL
Specific Gravity, Urine: 1.029 (ref 1.005–1.030)
pH: 5 (ref 5.0–8.0)

## 2021-08-09 LAB — LIPASE, BLOOD: Lipase: 32 U/L (ref 11–51)

## 2021-08-09 LAB — COMPREHENSIVE METABOLIC PANEL
ALT: 16 U/L (ref 0–44)
AST: 16 U/L (ref 15–41)
Albumin: 3.4 g/dL — ABNORMAL LOW (ref 3.5–5.0)
Alkaline Phosphatase: 61 U/L (ref 38–126)
Anion gap: 6 (ref 5–15)
BUN: 14 mg/dL (ref 6–20)
CO2: 28 mmol/L (ref 22–32)
Calcium: 9.1 mg/dL (ref 8.9–10.3)
Chloride: 105 mmol/L (ref 98–111)
Creatinine, Ser: 0.69 mg/dL (ref 0.44–1.00)
GFR, Estimated: >60 mL/min (ref >60)
Glucose, Bld: 119 mg/dL — ABNORMAL HIGH (ref 70–99)
Potassium: 4 mmol/L (ref 3.5–5.1)
Sodium: 139 mmol/L (ref 135–145)
Total Bilirubin: 0.3 mg/dL (ref 0.3–1.2)
Total Protein: 7.2 g/dL (ref 6.5–8.1)

## 2021-08-09 MED ORDER — MORPHINE SULFATE (PF) 4 MG/ML IV SOLN
4.0000 mg | Freq: Once | INTRAVENOUS | Status: AC
  Administered 2021-08-09: 4 mg via INTRAVENOUS
  Filled 2021-08-09: qty 1

## 2021-08-09 MED ORDER — LIDOCAINE 5 % EX PTCH
1.0000 | MEDICATED_PATCH | CUTANEOUS | Status: DC
  Administered 2021-08-09: 1 via TRANSDERMAL
  Filled 2021-08-09: qty 1

## 2021-08-09 MED ORDER — KETOROLAC TROMETHAMINE 30 MG/ML IJ SOLN
15.0000 mg | Freq: Once | INTRAMUSCULAR | Status: AC
  Administered 2021-08-09: 15 mg via INTRAVENOUS
  Filled 2021-08-09: qty 1

## 2021-08-09 NOTE — ED Provider Notes (Signed)
? ?[facility] ?Provider Note ? ? ? None  ?  (approximate) ? ? ?History  ? ?Flank Pain ? ? ?HPI ? ?Hailey Sheppard is a 56 y.o. female with a past medical history of HTN, GERD, migraine headaches, anxiety, arthritis and PMS2 Lynch related syndrome who presents for evaluation of some left-sided low back pain rating to the left flank that started about a week ago and seemed intermittent over the last couple days but got much worse and more persistent over the last 24 hours.  No right-sided symptoms, abdominal pain, burning with urination, diarrhea, chest pain, cough, shortness of breath, headache, earache, sore throat or extremity pain.  Patient states she has been little constipated last couple days.  No history of kidney stones.  No recent falls injuries or heavy lifting or twisting. ? ?  ?Past Medical History:  ?Diagnosis Date  ? Allergy   ? Anxiety   ? Arthritis   ? bil knees  ? Complication of anesthesia   ? pt panics easily feeling like she cant breathe would like to be up some not complete flat   ? Family history of ovarian cancer   ? GERD (gastroesophageal reflux disease)   ? Headache   ? occ-migraines  ? Hypertension   ? PCP prescribed pt bp med but pt never took it  ? PMS2-related Lynch syndrome (HNPCC4) 07/2018  ? MyRisk testing positive for PMS2 mutation  ? ? ? ?Physical Exam  ?Triage Vital Signs: ?ED Triage Vitals  ?Enc Vitals Group  ?   BP 08/09/21 2218 (!) 183/113  ?   Pulse Rate 08/09/21 2218 95  ?   Resp 08/09/21 2218 20  ?   Temp 08/09/21 2218 (!) 97.4 ?F (36.3 ?C)  ?   Temp Source 08/09/21 2218 Oral  ?   SpO2 08/09/21 2218 100 %  ?   Weight 08/09/21 2218 220 lb (99.8 kg)  ?   Height 08/09/21 2218 '5\' 2"'  (1.575 m)  ?   Head Circumference --   ?   Peak Flow --   ?   Pain Score 08/09/21 2222 9  ?   Pain Loc --   ?   Pain Edu? --   ?   Excl. in [place]? --   ? ? ?Most recent vital signs: ?Vitals:  ? 08/09/21 2218  ?BP: (!) 183/113  ?Pulse: 95  ?Resp: 20  ?Temp: (!) 97.4 ?F (36.3 ?C)   ?SpO2: 100%  ? ? ?General: Awake, appears uncomfortable. ?CV:  Good peripheral perfusion.  2+ radial pulses. ?Resp:  Normal effort.  Clear bilaterally. ?Abd:  No distention.  Soft throughout. ?Other:  Some tenderness over the left flank area and left CVA area without significant overlying skin changes.  No right-sided CVA tenderness or abdominal tenderness. ? ? ?ED Results / Procedures / Treatments  ?Labs ?(all labs ordered are listed, but only abnormal results are displayed) ?Labs Reviewed  ?COMPREHENSIVE METABOLIC PANEL - Abnormal; Notable for the following components:  ?    Result Value  ? Glucose, Bld 119 (*)   ? Albumin 3.4 (*)   ? All other components within normal limits  ?URINALYSIS, COMPLETE (UACMP) WITH MICROSCOPIC - Abnormal; Notable for the following components:  ? Color, Urine YELLOW (*)   ? APPearance CLEAR (*)   ? All other components within normal limits  ?LIPASE, BLOOD  ?CBC  ? ? ? ?EKG ? ?EKG is remarkable sinus rhythm with a ventricular rate of 79, normal axis,  unremarkable intervals without evidence of acute ischemia or significant arrhythmia. ? ? ?RADIOLOGY ? ?CT abdomen pelvis stone protocol on my interpretation shows no evidence of stone, perinephric stranding, AAA, diverticulitis or other acute abdominal or pelvic process.  I also reviewed radiology's interpretation and agree with their findings of same as well as large esophageal hiatal hernia and aortic atherosclerosis.  We also make notation of the previous surgery with partial colonic resection with ileocolonic anastomosis.  No wall thickening or inflammatory changes noted. ? ? ?PROCEDURES: ? ?Critical Care performed: No ? ?.1-3 Lead EKG Interpretation ?Performed by: Lucrezia Starch, MD ?Authorized by: Lucrezia Starch, MD  ? ?  Interpretation: normal   ?  ECG rate assessment: normal   ?  Rhythm: sinus rhythm   ?  Ectopy: none   ?  Conduction: normal   ? ?The patient is on the cardiac monitor to evaluate for evidence of arrhythmia  and/or significant heart rate changes. ? ? ?MEDICATIONS ORDERED IN ED: ?Medications  ?lidocaine (LIDODERM) 5 % 1 patch (1 patch Transdermal Patch Applied 08/09/21 2325)  ?morphine (PF) 4 MG/ML injection 4 mg (4 mg Intravenous Given 08/09/21 2248)  ?ketorolac (TORADOL) 30 MG/ML injection 15 mg (15 mg Intravenous Given 08/09/21 2322)  ? ? ? ?IMPRESSION / MDM / ASSESSMENT AND PLAN / ED COURSE  ?I reviewed the triage vital signs and the nursing notes. ?             ?               ? ?Differential diagnosis includes, but is not limited to, kidney stone, MSK, early shingles not yet visible on exam, diverticulitis, pyelonephritis, atypical ACS. ? ?CT abdomen pelvis stone protocol on my interpretation shows no evidence of stone, perinephric stranding, AAA, diverticulitis or other acute abdominal or pelvic process.  I also reviewed radiology's interpretation and agree with their findings of same as well as large esophageal hiatal hernia and aortic atherosclerosis.  We also make notation of the previous surgery with partial colonic resection with ileocolonic anastomosis.  No wall thickening or inflammatory changes noted. ? ?CMP without any significant electrolyte or metabolic derangements.  Lipase not suggestive of acute pancreatitis.  CBC without leukocytosis or acute anemia.  Urine without evidence of any blood or evidence of infection. ? ?Given there is an area of skin and muscle in the left flank area that has some very reproducible tenderness without any significant overlying skin changes and otherwise with reassuring CT and labs I suspect possibly MSK.  Low suspicion for other immediate life-threatening pathology at this time.  Patient is feeling better after some analgesia.  She is able to ambulate in the emergency room without significant difficulty.  I think she is stable for discharge and continued outpatient evaluation.  Advised to have blood pressure rechecked and discussed finding evidence of hiatal hernia on CT.   Discharged in stable condition.  Strict return precautions advised and discussed. ?  ? ? ?FINAL CLINICAL IMPRESSION(S) / ED DIAGNOSES  ? ?Final diagnoses:  ?Flank pain  ?Hypertension, unspecified type  ?Hiatal hernia  ? ? ? ?Rx / DC Orders  ? ?ED Discharge Orders   ? ? None  ? ?  ? ? ? ?Note:  This document was prepared using Dragon voice recognition software and may include unintentional dictation errors. ?  ?Lucrezia Starch, MD ?08/09/21 2326 ? ?

## 2021-08-09 NOTE — Discharge Instructions (Addendum)
Your blood pressure was quite high today.  Please follow this up with your primary care doctor to have it rechecked. ? ?Your CT today showed: ?FINDINGS: ?Lower chest: Lung bases are clear.  Large esophageal hiatal hernia. ?  ?Hepatobiliary: No focal liver abnormality is seen. Status post ?cholecystectomy. No biliary dilatation. ?  ?Pancreas: Unremarkable. No pancreatic ductal dilatation or ?surrounding inflammatory changes. ?  ?Spleen: Normal in size without focal abnormality. ?  ?Adrenals/Urinary Tract: Adrenal glands are unremarkable. Kidneys are ?normal, without renal calculi, focal lesion, or hydronephrosis. ?Bladder is unremarkable. ?  ?Stomach/Bowel: Stomach, small bowel, and colon are not abnormally ?distended. Partial resection of the right colon with ileocolonic ?anastomosis. Colon is stool filled. No wall thickening or ?inflammatory changes. Colonic diverticula without evidence of acute ?diverticulitis. ?  ?Vascular/Lymphatic: Aortic atherosclerosis. No enlarged abdominal or ?pelvic lymph nodes. ?  ?Reproductive: Status post hysterectomy. No adnexal masses. ?  ?Other: No abdominal wall hernia or abnormality. No abdominopelvic ?ascites. ?  ?Musculoskeletal: Degenerative changes in the hips. No acute bony ?abnormalities. ?  ?IMPRESSION: ?1. No acute process demonstrated in the abdomen or pelvis. No ?evidence of bowel obstruction or inflammation. Postoperative changes ?as above. ?2. Large esophageal hiatal hernia. ?3. Aortic atherosclerosis. ?

## 2021-08-09 NOTE — ED Triage Notes (Signed)
Pt complains of left upper back into flank pain for a week that has worsened today. Pt denies known fever, hematuria. Pt is tearful. Pt denies n/v.  ?

## 2021-08-13 DIAGNOSIS — Z1211 Encounter for screening for malignant neoplasm of colon: Secondary | ICD-10-CM | POA: Diagnosis not present

## 2021-08-21 LAB — COLOGUARD: COLOGUARD: POSITIVE — AB

## 2021-08-22 NOTE — Addendum Note (Signed)
Addended by: Myles Gip on: 08/22/2021 01:49 PM ? ? Modules accepted: Orders ? ?

## 2021-08-23 DIAGNOSIS — M1611 Unilateral primary osteoarthritis, right hip: Secondary | ICD-10-CM | POA: Diagnosis not present

## 2021-08-27 DIAGNOSIS — Z881 Allergy status to other antibiotic agents status: Secondary | ICD-10-CM | POA: Diagnosis not present

## 2021-08-27 DIAGNOSIS — K449 Diaphragmatic hernia without obstruction or gangrene: Secondary | ICD-10-CM | POA: Diagnosis not present

## 2021-08-27 DIAGNOSIS — K219 Gastro-esophageal reflux disease without esophagitis: Secondary | ICD-10-CM | POA: Diagnosis not present

## 2021-08-27 DIAGNOSIS — M1611 Unilateral primary osteoarthritis, right hip: Secondary | ICD-10-CM | POA: Diagnosis not present

## 2021-08-28 DIAGNOSIS — Z881 Allergy status to other antibiotic agents status: Secondary | ICD-10-CM | POA: Diagnosis not present

## 2021-08-28 DIAGNOSIS — M1611 Unilateral primary osteoarthritis, right hip: Secondary | ICD-10-CM | POA: Diagnosis not present

## 2021-09-03 DIAGNOSIS — M25651 Stiffness of right hip, not elsewhere classified: Secondary | ICD-10-CM | POA: Diagnosis not present

## 2021-09-03 DIAGNOSIS — M25551 Pain in right hip: Secondary | ICD-10-CM | POA: Diagnosis not present

## 2021-09-05 DIAGNOSIS — M25551 Pain in right hip: Secondary | ICD-10-CM | POA: Diagnosis not present

## 2021-09-05 DIAGNOSIS — M25651 Stiffness of right hip, not elsewhere classified: Secondary | ICD-10-CM | POA: Diagnosis not present

## 2021-09-11 DIAGNOSIS — M25651 Stiffness of right hip, not elsewhere classified: Secondary | ICD-10-CM | POA: Diagnosis not present

## 2021-09-11 DIAGNOSIS — M25551 Pain in right hip: Secondary | ICD-10-CM | POA: Diagnosis not present

## 2021-09-19 DIAGNOSIS — M25551 Pain in right hip: Secondary | ICD-10-CM | POA: Diagnosis not present

## 2021-09-19 DIAGNOSIS — M25651 Stiffness of right hip, not elsewhere classified: Secondary | ICD-10-CM | POA: Diagnosis not present

## 2021-09-23 DIAGNOSIS — M25651 Stiffness of right hip, not elsewhere classified: Secondary | ICD-10-CM | POA: Diagnosis not present

## 2021-09-23 DIAGNOSIS — M25551 Pain in right hip: Secondary | ICD-10-CM | POA: Diagnosis not present

## 2021-09-26 DIAGNOSIS — M25551 Pain in right hip: Secondary | ICD-10-CM | POA: Diagnosis not present

## 2021-09-26 DIAGNOSIS — M25651 Stiffness of right hip, not elsewhere classified: Secondary | ICD-10-CM | POA: Diagnosis not present

## 2021-10-01 DIAGNOSIS — M25551 Pain in right hip: Secondary | ICD-10-CM | POA: Diagnosis not present

## 2021-10-01 DIAGNOSIS — M25651 Stiffness of right hip, not elsewhere classified: Secondary | ICD-10-CM | POA: Diagnosis not present

## 2021-10-03 DIAGNOSIS — M25551 Pain in right hip: Secondary | ICD-10-CM | POA: Diagnosis not present

## 2021-10-03 DIAGNOSIS — M25651 Stiffness of right hip, not elsewhere classified: Secondary | ICD-10-CM | POA: Diagnosis not present

## 2021-10-07 DIAGNOSIS — M25551 Pain in right hip: Secondary | ICD-10-CM | POA: Diagnosis not present

## 2021-10-07 DIAGNOSIS — M25651 Stiffness of right hip, not elsewhere classified: Secondary | ICD-10-CM | POA: Diagnosis not present

## 2021-10-07 DIAGNOSIS — Z96641 Presence of right artificial hip joint: Secondary | ICD-10-CM | POA: Diagnosis not present

## 2021-10-10 DIAGNOSIS — M25551 Pain in right hip: Secondary | ICD-10-CM | POA: Diagnosis not present

## 2021-10-10 DIAGNOSIS — M25651 Stiffness of right hip, not elsewhere classified: Secondary | ICD-10-CM | POA: Diagnosis not present

## 2022-01-25 DIAGNOSIS — N390 Urinary tract infection, site not specified: Secondary | ICD-10-CM | POA: Diagnosis not present

## 2022-03-24 IMAGING — CR DG HIP (WITH OR WITHOUT PELVIS) 2-3V*R*
1 series · 4 of 4 positions shown · non-contrast
Comparison: None.

CLINICAL DATA: Right hip pain.

EXAM:
DG HIP (WITH OR WITHOUT PELVIS) 2-3V RIGHT

[Series 1: dg hip unilat w or w/o pelvis 2-3 views  · non-contrast · 0.14mm/px · 4 of 4 slices shown]
[im 1/4]
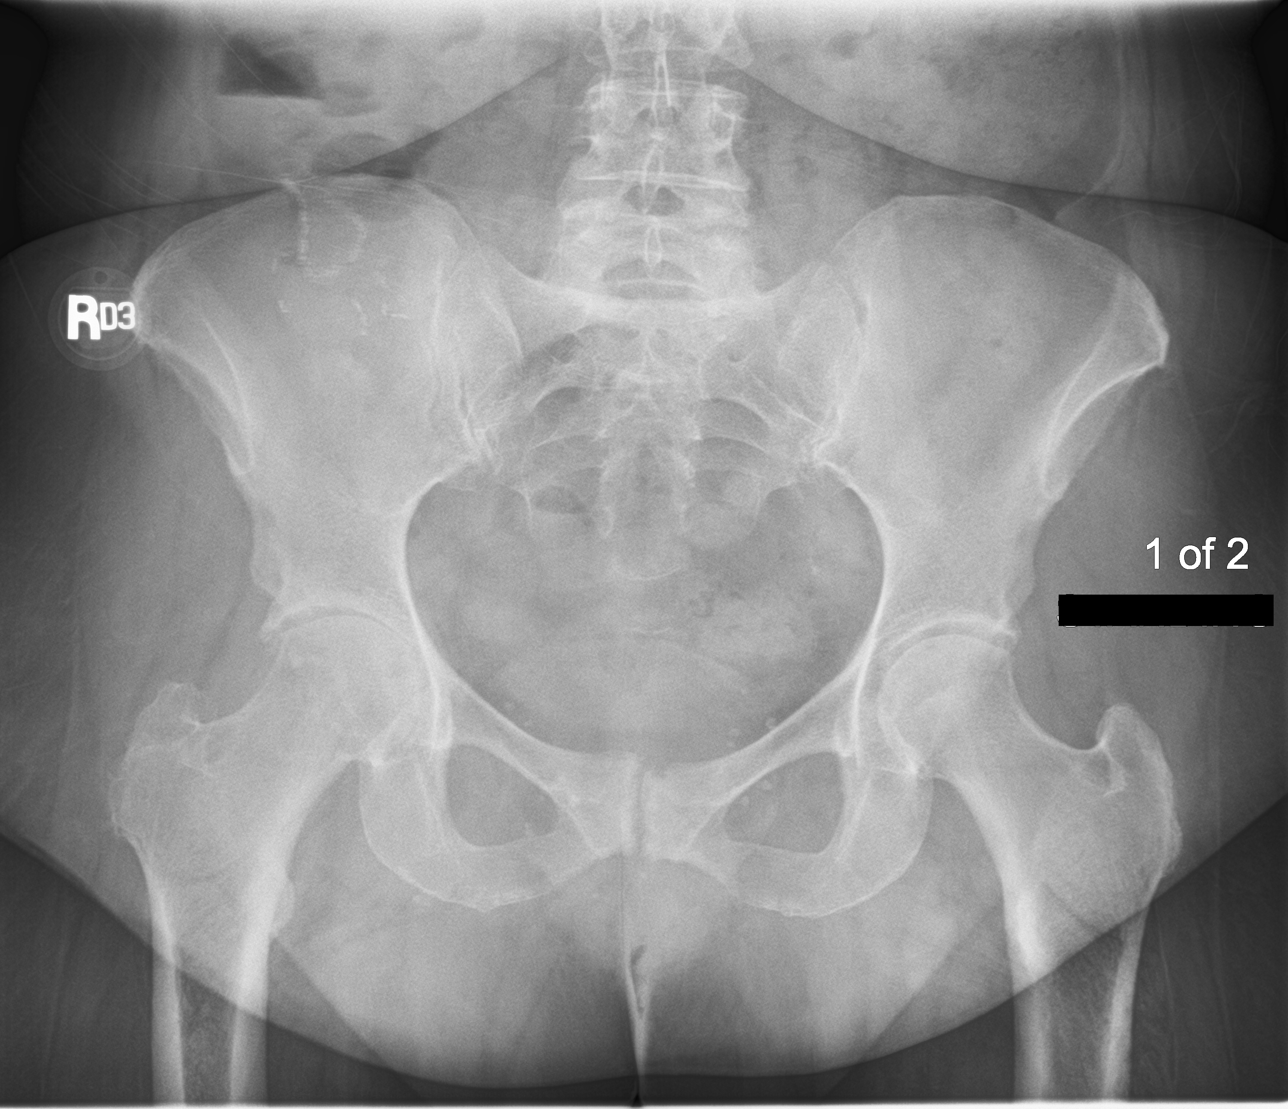
[im 2/4]
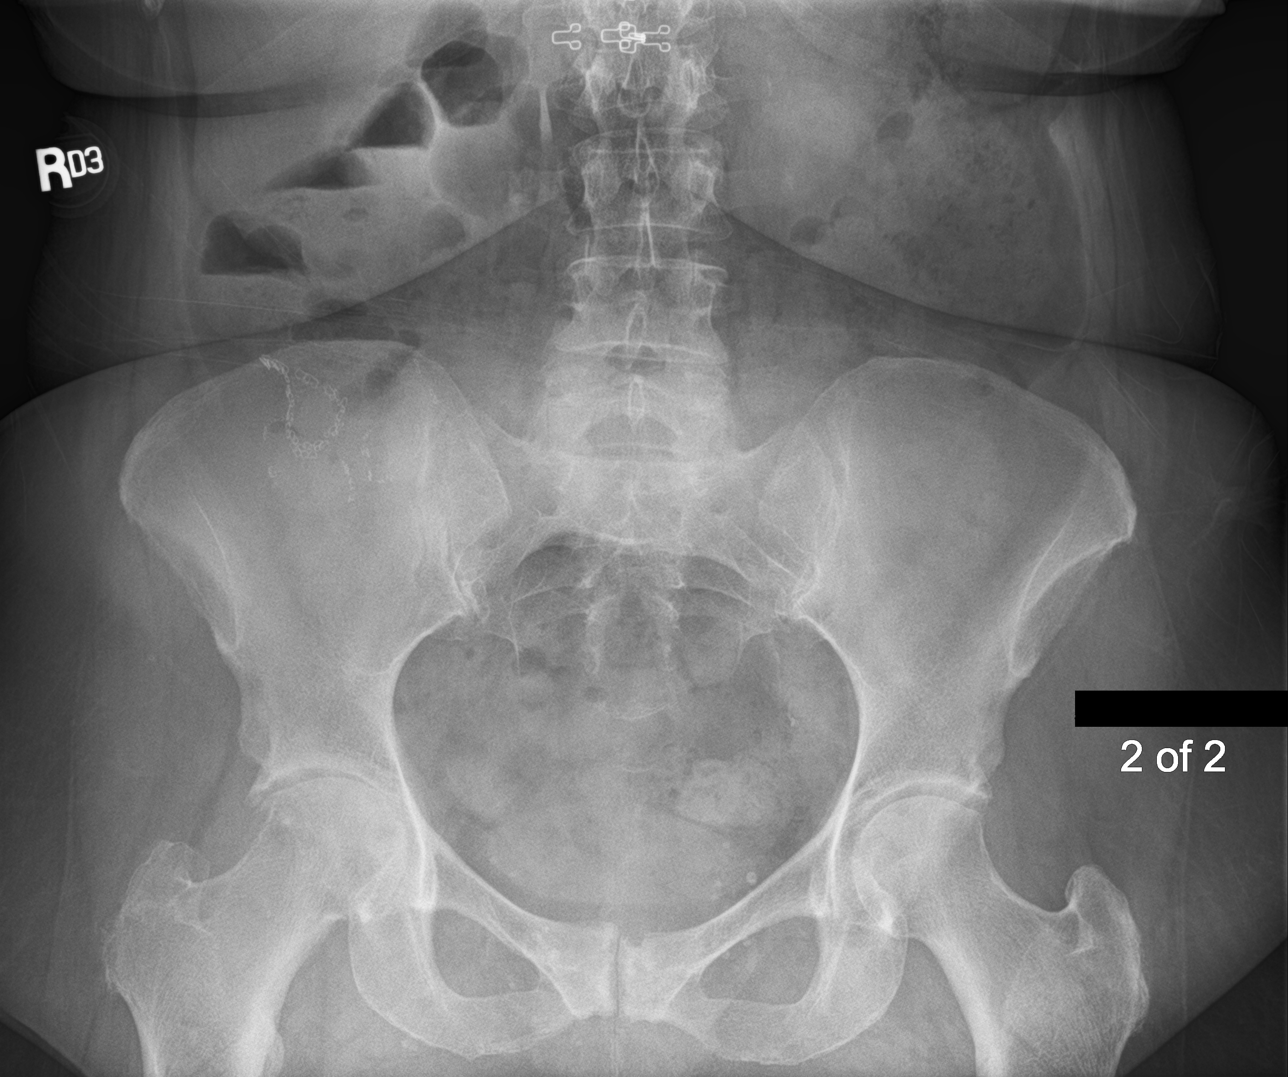
[im 3/4]
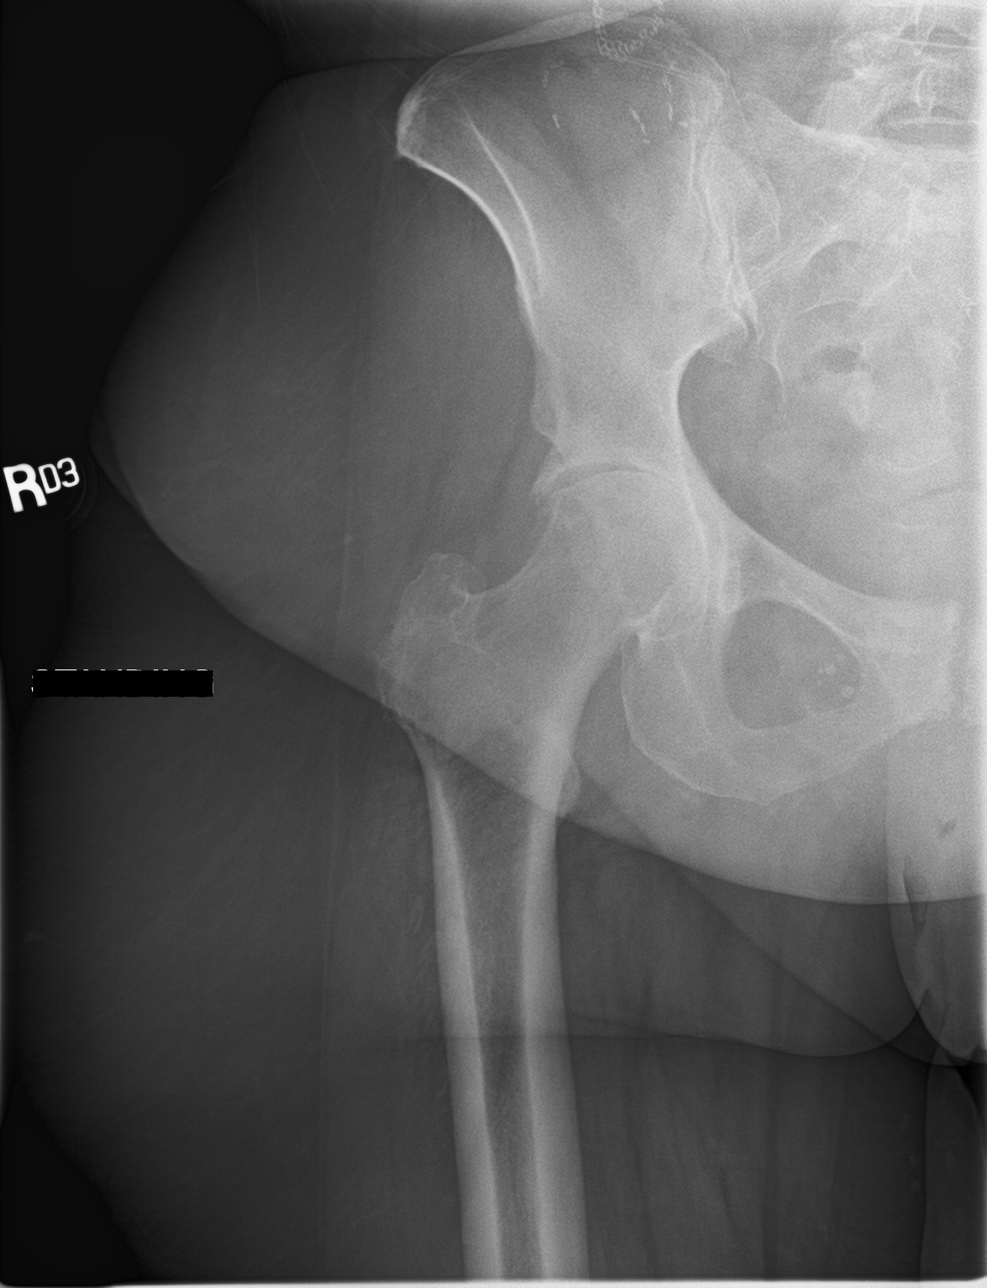
[im 4/4]
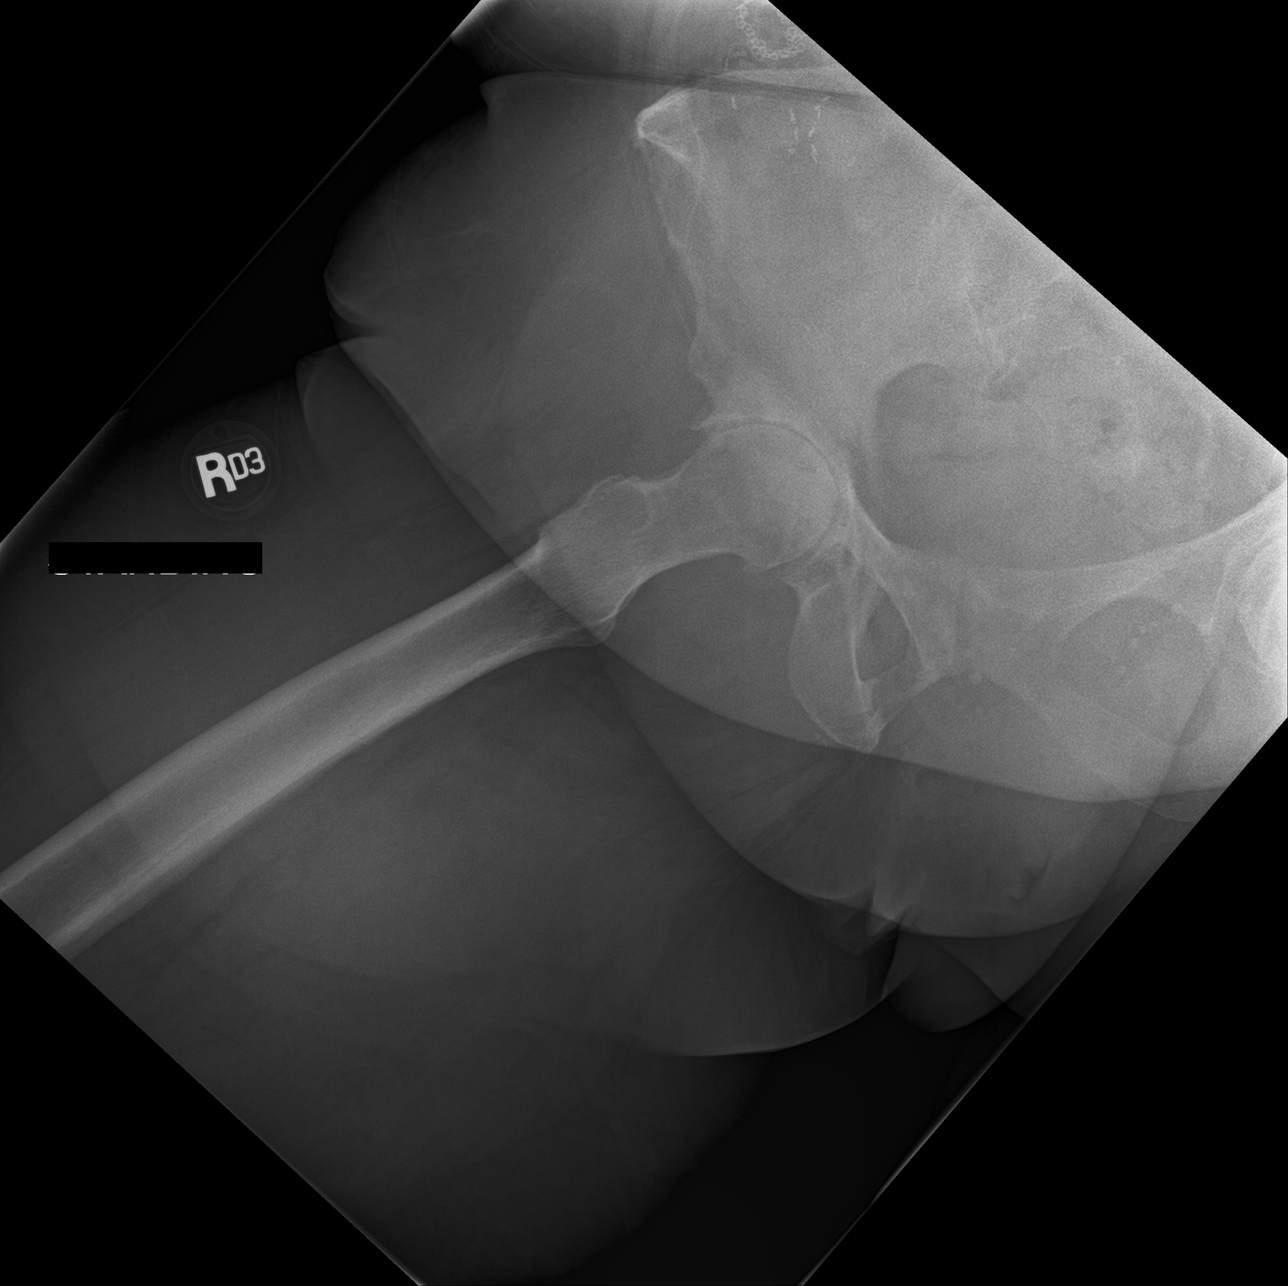

[4 of 4 positions shown; findings below may reference images not displayed]

FINDINGS: Moderate degenerative change right hip with joint space narrowing
and spurring. Small lucency femoral head with sclerotic changes
femoral head.

Degenerative changes and sclerosis in the pubic symphysis.

Negative for fracture or bone lesion.

Bowel staples in the region of the cecum.
IMPRESSION: Moderate degenerative change right hip. Possible underlying AVN of
the femoral head. No acute skeletal abnormality.

## 2023-06-17 NOTE — Progress Notes (Signed)
Name: Hailey Sheppard   MRN: [id number]    DOB: 1965-11-15   Date:06/18/2023       Progress Note  Subjective  Chief Complaint  Chief Complaint  Patient presents with   Annual Exam    HPI  Patient presents for annual CPE.  Diet: Eats out a lots, sometimes fast food  Exercise:  None Last Eye Exam: will schedule Last Dental Exam:  will schedule  Flowsheet Row Office Visit from 06/18/2023 in [facility]  AUDIT-C Score 0      Depression: Phq 9 is  negative    06/18/2023    2:15 PM 07/29/2020    2:42 PM 01/29/2020    9:27 AM 03/02/2018   12:04 PM 12/16/2016   11:21 AM  Depression screen PHQ 2/9  Decreased Interest 0 0 0 0 0  Down, Depressed, Hopeless 0 2 0 0 0  PHQ - 2 Score 0 2 0 0 0  Altered sleeping    0   Tired, decreased energy    0   Change in appetite    0   Feeling bad or failure about yourself     0   Trouble concentrating    0   Moving slowly or fidgety/restless    0   Suicidal thoughts    0   PHQ-9 Score    0   Difficult doing work/chores    Not difficult at all    Hypertension: BP Readings from Last 3 Encounters:  06/18/23 124/80  08/09/21 (!) 174/111  07/29/20 (!) 160/100   Obesity: Wt Readings from Last 3 Encounters:  06/18/23 238 lb 11.2 oz (108.3 kg)  08/09/21 220 lb (99.8 kg)  07/29/20 217 lb 14.4 oz (98.8 kg)   BMI Readings from Last 3 Encounters:  06/18/23 43.66 kg/m  08/09/21 40.24 kg/m  07/29/20 39.85 kg/m     Vaccines: no vaccine history, patient does not get regular vaccines. Is due for Tdap but patient does not want to get today.  Hep C Screening: getting today STD testing and prevention (HIV/chl/gon/syphilis): no concerns Intimate partner violence: negative screen  Menstrual History: underwent complete hysterectomy a few years ago Discussed importance of follow up if any post-menopausal bleeding: no  Incontinence Symptoms: negative for symptoms   Breast cancer:  - Last Mammogram: will  schedule  Osteoporosis Prevention : Discussed high calcium and vitamin D supplementation, weight bearing exercises Bone density :no   Cervical cancer screening: not applicable due to hysterectomy; had in 2021 due to fibroids   Skin cancer: Discussed monitoring for atypical lesions  Colorectal cancer: Cologuard positive 08/13/2021, will place referral  Lung cancer:  Low Dose CT Chest recommended if Age 43-80 years, 20 pack-year currently smoking OR have quit w/in 15years. Patient does not qualify for screen   ECG: 08/09/21  Advanced Care Planning: A voluntary discussion about advance care planning including the explanation and discussion of advance directives.  Discussed health care proxy and Living will, and the patient was unable to identify a health care proxy.  Patient does not have a living will and power of attorney of health care   Patient Active Problem List   Diagnosis Date Noted   Leg pain 07/30/2020   S/P laparoscopic hysterectomy 03/11/2020   Intramural and subserous leiomyoma of uterus    Pelvic pain    Genetic carrier of heritable cancer 08/04/2018   Dysmenorrhea 07/07/2018   Fibroids, submucosal 07/07/2018   Effusion of left knee joint 03/07/2018  Degenerative arthritis of left knee 03/07/2018   Degenerative arthritis of right knee 03/07/2018   Preventative health care 12/16/2016   Allergy 05/27/2016   GERD (gastroesophageal reflux disease) 05/27/2016    Past Surgical History:  Procedure Laterality Date   APPENDECTOMY  1980   Open with Bowel Resection due to Perforation   CHOLECYSTECTOMY N/A 06/17/2016   Procedure: LAPAROSCOPIC CHOLECYSTECTOMY;  Surgeon: Lattie Haw, MD;  Location: [facility] ORS;  Service: General;  Laterality: N/A;   COLON SURGERY  1985   Bowel Resection during Appendectomy secondary to Perforation   CYSTOSCOPY  02/29/2020   Procedure: CYSTOSCOPY;  Surgeon: Nadara Mustard, MD;  Location: [facility] ORS;  Service: Gynecology;;   DILATATION &  CURETTAGE/HYSTEROSCOPY WITH TRUECLEAR N/A 07/07/2018   Procedure: DILATATION & CURETTAGE/HYSTEROSCOPY WITH TRUCLEAR;  Surgeon: Nadara Mustard, MD;  Location: [facility] ORS;  Service: Gynecology;  Laterality: N/A;   GALLBLADDER SURGERY     januray 17th 2018   TOTAL LAPAROSCOPIC HYSTERECTOMY WITH BILATERAL SALPINGO OOPHORECTOMY Bilateral 02/29/2020   Procedure: TOTAL LAPAROSCOPIC HYSTERECTOMY WITH BILATERAL SALPINGO OOPHORECTOMY;  Surgeon: Nadara Mustard, MD;  Location: [facility] ORS;  Service: Gynecology;  Laterality: Bilateral;    Family History  Problem Relation Age of Onset   Diabetes Mother    Obesity Mother    Diabetes Father    Gallbladder disease Brother    Ovarian cancer Maternal Grandmother 16   Heart disease Maternal Grandfather    Gallbladder disease Maternal Grandfather    Stroke Paternal Grandmother    Kidney cancer Paternal Grandmother     Social History   Socioeconomic History   Marital status: Married    Spouse name: Brett Canales   Number of children: Not on file   Years of education: 12   Highest education level: High school graduate  Occupational History   Occupation: [employer]  Tobacco Use   Smoking status: Never    Passive exposure: Never   Smokeless tobacco: Never  Vaping Use   Vaping status: Never Used  Substance and Sexual Activity   Alcohol use: Not Currently    Comment: rare   Drug use: No   Sexual activity: Yes  Other Topics Concern   Not on file  Social History Narrative   Not on file   Social Drivers of Health   Financial Resource Strain: Low Risk  (06/18/2023)   Overall Financial Resource Strain (CARDIA)    Difficulty of Paying Living Expenses: Not hard at all  Food Insecurity: No Food Insecurity (06/18/2023)   Hunger Vital Sign    Worried About Running Out of Food in the Last Year: Never true    Ran Out of Food in the Last Year: Never true  Transportation Needs: No Transportation Needs (06/18/2023)   PRAPARE - Scientist, research (physical sciences) (Medical): No    Lack of Transportation (Non-Medical): No  Physical Activity: Inactive (06/18/2023)   Exercise Vital Sign    Days of Exercise per Week: 0 days    Minutes of Exercise per Session: 0 min  Stress: No Stress Concern Present (06/18/2023)   [employer] of Occupational Health - Occupational Stress Questionnaire    Feeling of Stress : Not at all  Social Connections: Moderately Isolated (06/18/2023)   Social Connection and Isolation Panel [NHANES]    Frequency of Communication with Friends and Family: More than three times a week    Frequency of Social Gatherings with Friends and Family: Once a week    Attends Religious Services: Never  Active Member of Clubs or Organizations: No    Attends Banker Meetings: Never    Marital Status: Married  Catering manager Violence: Not At Risk (06/18/2023)   Humiliation, Afraid, Rape, and Kick questionnaire    Fear of Current or Ex-Partner: No    Emotionally Abused: No    Physically Abused: No    Sexually Abused: No     Current Outpatient Medications:    acetaminophen (TYLENOL) 500 MG tablet, Take 1,000 mg by mouth every 6 (six) hours as needed for moderate pain. , Disp: , Rfl:    cholecalciferol (VITAMIN D) 1000 units tablet, Take 1,000 Units by mouth 3 (three) times a week. , Disp: , Rfl:    esomeprazole (NEXIUM) 20 MG capsule, Take 20 mg by mouth at bedtime. , Disp: , Rfl:    ibuprofen (ADVIL,MOTRIN) 200 MG tablet, Take 400 mg by mouth every 6 (six) hours as needed for moderate pain. , Disp: , Rfl:    loratadine (CLARITIN) 10 MG tablet, Take 10 mg by mouth daily as needed for allergies., Disp: , Rfl:    Misc Natural Products (DIETERS HERBAL COMBINATION PO), Take 2 capsules by mouth 2 (two) times daily. [employer] , Disp: , Rfl:    Multiple Vitamins-Minerals (MULTIVITAMIN GUMMIES ADULT PO), Take 2 tablets by mouth daily., Disp: , Rfl:    OVER THE COUNTER MEDICATION, Take 2 tablets by mouth daily. Lottie Dawson,  Disp: , Rfl:    polyethylene glycol powder (GLYCOLAX/MIRALAX) powder, Take 17 g by mouth daily as needed for moderate constipation. , Disp: , Rfl:    meloxicam (MOBIC) 15 MG tablet, TAKE 1 TABLET (15 MG TOTAL) BY MOUTH DAILY. (Patient not taking: Reported on 06/18/2023), Disp: 30 tablet, Rfl: 0   OVER THE COUNTER MEDICATION, Take 2 tablets by mouth daily. Amberen (Patient not taking: Reported on 07/29/2020), Disp: , Rfl:    pseudoephedrine-acetaminophen (TYLENOL SINUS) 30-500 MG TABS tablet, Take 2-3 tablets by mouth daily.  (Patient not taking: Reported on 06/18/2023), Disp: , Rfl:    tetrahydrozoline-zinc (VISINE-AC) 0.05-0.25 % ophthalmic solution, Place 2 drops into both eyes 3 (three) times daily as needed (dry/irritated eyes).  (Patient not taking: Reported on 06/18/2023), Disp: , Rfl:    Triamcinolone Acetonide (NASACORT AQ NA), Place 1 spray into the nose daily. (Patient not taking: Reported on 06/18/2023), Disp: , Rfl:    trolamine salicylate (ASPERCREME) 10 % cream, Apply 1 application topically as needed for muscle pain. (Patient not taking: Reported on 06/18/2023), Disp: , Rfl:   Allergies  Allergen Reactions   Erythromycin Nausea And Vomiting     Review of Systems  All other systems reviewed and are negative.   Objective  Vitals:   06/18/23 1416  BP: 124/80  Pulse: 100  Resp: 16  Temp: 98.2 F (36.8 C)  SpO2: 95%  Weight: 238 lb 11.2 oz (108.3 kg)  Height: 5\' 2"  (1.575 m)    Body mass index is 43.66 kg/m.  Physical Exam Constitutional:      Appearance: Normal appearance.  HENT:     Head: Normocephalic and atraumatic.     Mouth/Throat:     Mouth: Mucous membranes are moist.     Pharynx: Oropharynx is clear.  Eyes:     Extraocular Movements: Extraocular movements intact.     Conjunctiva/sclera: Conjunctivae normal.     Pupils: Pupils are equal, round, and reactive to light.  Neck:     Comments: No thyromegaly Cardiovascular:     Rate and Rhythm:  Normal rate and  regular rhythm.  Pulmonary:     Effort: Pulmonary effort is normal.     Breath sounds: Normal breath sounds.  Musculoskeletal:     Cervical back: No tenderness.     Right lower leg: No edema.     Left lower leg: No edema.  Lymphadenopathy:     Cervical: No cervical adenopathy.  Skin:    General: Skin is warm and dry.  Neurological:     General: No focal deficit present.     Mental Status: She is alert. Mental status is at baseline.  Psychiatric:        Mood and Affect: Mood normal.        Behavior: Behavior normal.     Last CBC Lab Results  Component Value Date   WBC 8.8 08/09/2021   HGB 12.9 08/09/2021   HCT 40.4 08/09/2021   MCV 91.2 08/09/2021   MCH 29.1 08/09/2021   RDW 13.2 08/09/2021   PLT 221 08/09/2021   Last metabolic panel Lab Results  Component Value Date   GLUCOSE 119 (H) 08/09/2021   NA 139 08/09/2021   K 4.0 08/09/2021   CL 105 08/09/2021   CO2 28 08/09/2021   BUN 14 08/09/2021   CREATININE 0.69 08/09/2021   GFRNONAA >60 08/09/2021   CALCIUM 9.1 08/09/2021   PROT 7.2 08/09/2021   ALBUMIN 3.4 (L) 08/09/2021   BILITOT 0.3 08/09/2021   ALKPHOS 61 08/09/2021   AST 16 08/09/2021   ALT 16 08/09/2021   ANIONGAP 6 08/09/2021   Last lipids Lab Results  Component Value Date   CHOL 207 (H) 03/02/2018   HDL 54 03/02/2018   LDLCALC 138 (H) 03/02/2018   TRIG 55 03/02/2018   CHOLHDL 3.8 03/02/2018   Last hemoglobin A1c No results found for: "HGBA1C" Last thyroid functions Lab Results  Component Value Date   TSH 0.93 03/02/2018   Last vitamin D No results found for: "25OHVITD2", "25OHVITD3", "VD25OH" Last vitamin B12 and Folate No results found for: "VITAMINB12", "FOLATE"    Assessment & Plan  1. Annual physical exam (Primary)/Lipid screening/Need for hepatitis C screening test/Screening for HIV (human immunodeficiency virus): Physical exam completed, health maintenance reviewed and annual labs ordered.   - MM 3D SCREENING MAMMOGRAM  BILATERAL BREAST; Future - CBC w/Diff/Platelet - Comprehensive Metabolic Panel (CMET) - Lipid Profile - HIV antibody (with reflex) - Hepatitis C Antibody - HgB A1c - TSH  2. Encounter for screening mammogram for malignant neoplasm of breast: Mammogram ordered.   - MM 3D SCREENING MAMMOGRAM BILATERAL BREAST; Future  3. Positive colorectal cancer screening using Cologuard test/Colon cancer screening: Referral placed for screening colonoscopy due to positive Cologuard.   - Ambulatory referral to Gastroenterology   -USPSTF grade A and B recommendations reviewed with patient; age-appropriate recommendations, preventive care, screening tests, etc discussed and encouraged; healthy living encouraged; see AVS for patient education given to patient -Discussed importance of 150 minutes of physical activity weekly, eat two servings of fish weekly, eat one serving of tree nuts ( cashews, pistachios, pecans, almonds.Marland Kitchen) every other day, eat 6 servings of fruit/vegetables daily and drink plenty of water and avoid sweet beverages.   -Reviewed Health Maintenance: Yes.

## 2023-06-18 ENCOUNTER — Encounter: Payer: Self-pay | Admitting: Internal Medicine

## 2023-06-18 ENCOUNTER — Ambulatory Visit: Payer: BC Managed Care – PPO | Admitting: Internal Medicine

## 2023-06-18 VITALS — BP 124/80 | HR 100 | Temp 98.2°F | Resp 16 | Ht 62.0 in | Wt 238.7 lb

## 2023-06-18 DIAGNOSIS — R195 Other fecal abnormalities: Secondary | ICD-10-CM

## 2023-06-18 DIAGNOSIS — Z1231 Encounter for screening mammogram for malignant neoplasm of breast: Secondary | ICD-10-CM

## 2023-06-18 DIAGNOSIS — Z114 Encounter for screening for human immunodeficiency virus [HIV]: Secondary | ICD-10-CM | POA: Diagnosis not present

## 2023-06-18 DIAGNOSIS — Z0000 Encounter for general adult medical examination without abnormal findings: Secondary | ICD-10-CM

## 2023-06-18 DIAGNOSIS — Z1211 Encounter for screening for malignant neoplasm of colon: Secondary | ICD-10-CM

## 2023-06-18 DIAGNOSIS — Z1159 Encounter for screening for other viral diseases: Secondary | ICD-10-CM

## 2023-06-18 DIAGNOSIS — Z13220 Encounter for screening for lipoid disorders: Secondary | ICD-10-CM | POA: Diagnosis not present

## 2023-06-18 NOTE — Patient Instructions (Addendum)
It was great seeing you today!  Plan discussed at today's visit: -Blood work ordered today, results will be uploaded to MyChart.  -Try over the counter Black cohosh to see if if helps with menopausal symptoms -Recommend trying over the counter Zyrtec or Xyzal for allergies and nasal saline   -Mammogram ordered, please call to schedule -Referral to GI ordered, we will call you to set up this up -Consider Tdap vaccine   Follow up in: 1 year   Take care and let us know if you have any questions or concerns prior to your next visit.  Dr. Caralee Ates

## 2023-06-22 DIAGNOSIS — R399 Unspecified symptoms and signs involving the genitourinary system: Secondary | ICD-10-CM | POA: Diagnosis not present

## 2023-06-22 DIAGNOSIS — N3001 Acute cystitis with hematuria: Secondary | ICD-10-CM | POA: Diagnosis not present

## 2023-06-22 DIAGNOSIS — R109 Unspecified abdominal pain: Secondary | ICD-10-CM | POA: Diagnosis not present

## 2023-07-02 DIAGNOSIS — Z0000 Encounter for general adult medical examination without abnormal findings: Secondary | ICD-10-CM | POA: Diagnosis not present

## 2023-07-02 DIAGNOSIS — Z1159 Encounter for screening for other viral diseases: Secondary | ICD-10-CM | POA: Diagnosis not present

## 2023-07-02 DIAGNOSIS — Z114 Encounter for screening for human immunodeficiency virus [HIV]: Secondary | ICD-10-CM | POA: Diagnosis not present

## 2023-07-02 DIAGNOSIS — Z13220 Encounter for screening for lipoid disorders: Secondary | ICD-10-CM | POA: Diagnosis not present

## 2023-07-03 LAB — COMPREHENSIVE METABOLIC PANEL
ALT: 17 [IU]/L (ref 0–32)
AST: 16 [IU]/L (ref 0–40)
Albumin: 4.3 g/dL (ref 3.8–4.9)
Alkaline Phosphatase: 90 [IU]/L (ref 44–121)
BUN/Creatinine Ratio: 15 (ref 9–23)
BUN: 10 mg/dL (ref 6–24)
Bilirubin Total: <0.2 mg/dL (ref 0.0–1.2)
CO2: 23 mmol/L (ref 20–29)
Calcium: 9.5 mg/dL (ref 8.7–10.2)
Chloride: 103 mmol/L (ref 96–106)
Creatinine, Ser: 0.68 mg/dL (ref 0.57–1.00)
Globulin, Total: 2.9 g/dL (ref 1.5–4.5)
Glucose: 85 mg/dL (ref 70–99)
Potassium: 4.4 mmol/L (ref 3.5–5.2)
Sodium: 141 mmol/L (ref 134–144)
Total Protein: 7.2 g/dL (ref 6.0–8.5)
eGFR: 101 mL/min/{1.73_m2} (ref >59)

## 2023-07-03 LAB — CBC WITH DIFFERENTIAL/PLATELET
Basophils Absolute: 0.1 10*3/uL (ref 0.0–0.2)
Basos: 1 %
EOS (ABSOLUTE): 0.1 10*3/uL (ref 0.0–0.4)
Eos: 1 %
Hematocrit: 41.4 % (ref 34.0–46.6)
Hemoglobin: 13.1 g/dL (ref 11.1–15.9)
Immature Grans (Abs): 0 10*3/uL (ref 0.0–0.1)
Immature Granulocytes: 0 %
Lymphocytes Absolute: 2 10*3/uL (ref 0.7–3.1)
Lymphs: 29 %
MCH: 28.9 pg (ref 26.6–33.0)
MCHC: 31.6 g/dL (ref 31.5–35.7)
MCV: 91 fL (ref 79–97)
Monocytes Absolute: 0.4 10*3/uL (ref 0.1–0.9)
Monocytes: 6 %
Neutrophils Absolute: 4.3 10*3/uL (ref 1.4–7.0)
Neutrophils: 63 %
Platelets: 251 10*3/uL (ref 150–450)
RBC: 4.53 x10E6/uL (ref 3.77–5.28)
RDW: 13.9 % (ref 11.7–15.4)
WBC: 6.9 10*3/uL (ref 3.4–10.8)

## 2023-07-03 LAB — HEMOGLOBIN A1C
Est. average glucose Bld gHb Est-mCnc: 140 mg/dL
Hgb A1c MFr Bld: 6.5 % — ABNORMAL HIGH (ref 4.8–5.6)

## 2023-07-03 LAB — LIPID PANEL
Chol/HDL Ratio: 4.3 {ratio} (ref 0.0–4.4)
Cholesterol, Total: 224 mg/dL — ABNORMAL HIGH (ref 100–199)
HDL: 52 mg/dL (ref >39)
LDL Chol Calc (NIH): 141 mg/dL — ABNORMAL HIGH (ref 0–99)
Triglycerides: 175 mg/dL — ABNORMAL HIGH (ref 0–149)
VLDL Cholesterol Cal: 31 mg/dL (ref 5–40)

## 2023-07-03 LAB — HIV ANTIBODY (ROUTINE TESTING W REFLEX): HIV Screen 4th Generation wRfx: NONREACTIVE

## 2023-07-03 LAB — HEPATITIS C ANTIBODY: Hep C Virus Ab: NONREACTIVE

## 2023-07-03 LAB — TSH: TSH: 0.79 u[IU]/mL (ref 0.450–4.500)

## 2023-07-05 ENCOUNTER — Encounter: Payer: Self-pay | Admitting: *Deleted

## 2023-07-18 ENCOUNTER — Encounter: Payer: Self-pay | Admitting: Emergency Medicine

## 2023-07-18 ENCOUNTER — Emergency Department: Payer: BC Managed Care – PPO

## 2023-07-18 ENCOUNTER — Other Ambulatory Visit: Payer: Self-pay

## 2023-07-18 ENCOUNTER — Emergency Department
Admission: EM | Admit: 2023-07-18 | Discharge: 2023-07-18 | Disposition: A | Discharge status: Home / Self Care | Payer: BC Managed Care – PPO | Attending: Emergency Medicine | Admitting: Emergency Medicine

## 2023-07-18 DIAGNOSIS — S8991XA Unspecified injury of right lower leg, initial encounter: Secondary | ICD-10-CM | POA: Diagnosis not present

## 2023-07-18 DIAGNOSIS — X58XXXA Exposure to other specified factors, initial encounter: Secondary | ICD-10-CM | POA: Insufficient documentation

## 2023-07-18 DIAGNOSIS — M25561 Pain in right knee: Secondary | ICD-10-CM | POA: Diagnosis not present

## 2023-07-18 DIAGNOSIS — S8391XA Sprain of unspecified site of right knee, initial encounter: Secondary | ICD-10-CM | POA: Diagnosis not present

## 2023-07-18 DIAGNOSIS — M25461 Effusion, right knee: Secondary | ICD-10-CM

## 2023-07-18 NOTE — ED Provider Notes (Signed)
[facility] Provider Note    Event Date/Time   First MD Initiated Contact with Patient 07/18/23 1351     (approximate)   History   Knee Pain   HPI Hailey Sheppard is a 58 y.o. female presenting today for right knee pain.  Patient states she has had intermittent pain in her right knee for several weeks.  Yesterday she was up walking around and felt a pop on the outside of her right knee.  Did not fall or hit the knee.  Denies any twisting motion.  Has been able to walk on it since but with a slight limp.  Denies injury elsewhere.  Not currently on blood thinners.  No redness, warmth to the knee.  No fevers.     Physical Exam   Triage Vital Signs: ED Triage Vitals  Encounter Vitals Group     BP 07/18/23 1202 (!) 155/98     Systolic BP Percentile --      Diastolic BP Percentile --      Pulse Rate 07/18/23 1202 87     Resp 07/18/23 1202 18     Temp 07/18/23 1202 98.2 F (36.8 C)     Temp Source 07/18/23 1202 Oral     SpO2 07/18/23 1202 97 %     Weight 07/18/23 1202 237 lb (107.5 kg)     Height 07/18/23 1202 5\' 2"  (1.575 m)     Head Circumference --      Peak Flow --      Pain Score 07/18/23 1201 7     Pain Loc --      Pain Education --      Exclude from Growth Chart --     Most recent vital signs: Vitals:   07/18/23 1202  BP: (!) 155/98  Pulse: 87  Resp: 18  Temp: 98.2 F (36.8 C)  SpO2: 97%   I have reviewed the vital signs. General:  Awake, alert, no acute distress. Head:  Normocephalic, Atraumatic. EENT:  PERRL, EOMI, Oral mucosa pink and moist, Neck is supple. Cardiovascular: Regular rate, 2+ distal pulses. Respiratory:  Normal respiratory effort, symmetrical expansion, no distress.   Extremities: Slight edema and effusion to the right knee.  No erythema or warmth.  Slight pain with passive range of motion but able to ambulate on it.  Slight tenderness palpation to the lateral inferior portion of the knee.  Varus and valgus testing  largely unremarkable.  Negative McMurray's. Neuro:  Alert and oriented.  Interacting appropriately.   Skin:  Warm, dry, no rash.   Psych: Appropriate affect.    ED Results / Procedures / Treatments   Labs (all labs ordered are listed, but only abnormal results are displayed) Labs Reviewed - No data to display   EKG    RADIOLOGY Independently interpreted x-ray with no acute pathology   PROCEDURES:  Critical Care performed: No  Procedures   MEDICATIONS ORDERED IN ED: Medications - No data to display   IMPRESSION / MDM / ASSESSMENT AND PLAN / ED COURSE  I reviewed the triage vital signs and the nursing notes.                              Differential diagnosis includes, but is not limited to, knee sprain, joint effusion, ligamentous injury, lower suspicion for acute osseous injury  Patient's presentation is most consistent with acute complicated illness / injury requiring diagnostic workup.  Patient is a 58 year old female presenting today for nontraumatic right knee injury.  Bedside exam possibly indicate sprain to one of the ligaments such as the LCL.  Otherwise knee is stable and patient able to ambulate.  X-ray shows no evidence of acute osseous injury.  No other signs of infection to the joint.  Will discharge with recommendations for RICE and follow-up with orthopedics as needed.  Patient was agreeable with plan given strict return precautions.     FINAL CLINICAL IMPRESSION(S) / ED DIAGNOSES   Final diagnoses:  Sprain of right knee, unspecified ligament, initial encounter  Pain and swelling of right knee     Rx / DC Orders   ED Discharge Orders     None        Note:  This document was prepared using Dragon voice recognition software and may include unintentional dictation errors.   Janith Lima, MD 07/18/23 [phone]

## 2023-07-18 NOTE — ED Triage Notes (Signed)
Patient to ED via POV for right knee pain. PT reports pain on the right side of knee. States she heard a "pop" last night when getting up to walk. Ambulatory to triage.

## 2023-07-18 NOTE — Discharge Instructions (Signed)
Please call your orthopedic clinic and set up a follow-up appointment in the next 1 to 2 weeks for reassessment.  You can use the Ace wrap for compression at home to help reduce swelling which will help with the pain.  Keep your leg elevated when not walking around and you can ice for 15 minutes at a time several times a day to help decrease swelling.  You can continue using ibuprofen and Tylenol as needed.

## 2023-07-26 DIAGNOSIS — M13861 Other specified arthritis, right knee: Secondary | ICD-10-CM | POA: Diagnosis not present

## 2023-07-26 DIAGNOSIS — M25561 Pain in right knee: Secondary | ICD-10-CM | POA: Diagnosis not present

## 2023-08-16 ENCOUNTER — Ambulatory Visit: Payer: Self-pay | Admitting: Family Medicine

## 2023-08-16 NOTE — Telephone Encounter (Signed)
  Chief Complaint: rash Symptoms: rash, headache, sinus pressure, ear congestion Frequency: 3-4 days Pertinent Negatives: Patient denies Fever, visual changes Disposition: [] ED /[] Urgent Care (no appt availability in office) / [x] Appointment(In office/virtual)/ []  [place] [facility]/ [] Home Care/ [] Refused Recommended Disposition /[] [place] [facility]/ []  Follow-up with PCP Additional Notes: Patient calls reporting rash on R side of head with headache and sinus pressure x 3-4 days. Reports she has leftover abx from previous visit that she began taking today. States the rash is raised bumps in her hair, ear fullness, and sinus pressure. Per protocol, patient to be evaluated within 24 hours. First available appointment with PCP 08/17/23 at 1540. PCare advice reviewed, patient verbalized understandingand denies further questions at this time. Alerting PCP for review.    Copied from CRM [id number]. Topic: Clinical - Red Word Triage >> Aug 16, 2023  3:52 PM Geroge Baseman wrote: Red Word that prompted transfer to Nurse Triage: Sinus infection headache turned severe, the right side now has bumps and severe itches Reason for Disposition  [1] Localized rash is very painful AND [2] no fever  Answer Assessment - Initial Assessment Questions 1. APPEARANCE of RASH: "Describe the rash."      Red bumps, raised up 2. LOCATION: "Where is the rash located?"      Right side of face, top of head and ear 3. NUMBER: "How many spots are there?"      Several, at least 5 or more 4. SIZE: "How big are the spots?" (Inches, centimeters or compare to size of a coin)      Smaller than an eraser 5. ONSET: "When did the rash start?"      3-4 days 6. ITCHING: "Does the rash itch?" If Yes, ask: "How bad is the itch?"  (Scale 0-10; or none, mild, moderate, severe)     Not itching at this time, just painful 7. PAIN: "Does the rash hurt?" If Yes, ask: "How bad is the pain?"  (Scale 0-10; or none, mild, moderate, severe)     - NONE (0): no pain    - MILD (1-3): doesn't interfere with normal activities     - MODERATE (4-7): interferes with normal activities or awakens from sleep     - SEVERE (8-10): excruciating pain, unable to do any normal activities     5/10 8. OTHER SYMPTOMS: "Do you have any other symptoms?" (e.g., fever)     Sinus pressure, ear fullness, headache  Protocols used: Rash or Redness - Localized-A-AH

## 2023-08-17 ENCOUNTER — Encounter: Payer: Self-pay | Admitting: Internal Medicine

## 2023-08-17 ENCOUNTER — Other Ambulatory Visit: Payer: Self-pay

## 2023-08-17 ENCOUNTER — Ambulatory Visit: Admitting: Internal Medicine

## 2023-08-17 VITALS — BP 126/72 | HR 100 | Temp 98.3°F | Resp 16 | Ht 62.0 in | Wt 232.7 lb

## 2023-08-17 DIAGNOSIS — B028 Zoster with other complications: Secondary | ICD-10-CM

## 2023-08-17 MED ORDER — VALACYCLOVIR HCL 1 G PO TABS: 1000.0000 mg | ORAL_TABLET | Freq: Three times a day (TID) | ORAL | 0 refills | Status: AC

## 2023-08-17 NOTE — Patient Instructions (Signed)
 Shingles  Shingles, or herpes zoster, is an infection. It gives you a skin rash and blisters. These infected areas may hurt a lot. Shingles only happens if: You've had chickenpox. You've been given a shot called a vaccine to protect you from getting chickenpox. Shingles is rare in this case. What are the causes? Shingles is caused by a germ called the varicella-zoster virus. This is the same germ that causes chickenpox. After you're exposed to the germ, it stays in your body but is dormant. This means it isn't active. Shingles happens if the germ becomes active again. This can happen years after you're first exposed to the germ. What increases the risk? You may be more likely to get shingles if: You're older than 59 years of age. You're under a lot of stress. You have a weak immune system. The immune system is your body's defense system. It may be weak if: You have human immunodeficiency virus (HIV). You have acquired immunodeficiency syndrome (AIDS). You have cancer. You take medicines that weaken your immune system. These include organ transplant medicines. What are the signs or symptoms? The first symptoms of shingles may be itching, tingling, or pain. Your skin may feel like it's burning. A few days or weeks later, you'll get a rash. Here's what you can expect: The rash is likely to be on one side of your body. The rash may be shaped like a belt or a band. Over time, it will turn into blisters filled with fluid. The blisters will break open and change into scabs. The scabs will dry up in about 2-3 weeks. You may also have: A fever. Chills. A headache. Nausea. How is this diagnosed? Shingles is diagnosed with a skin exam. A sample called a culture may be taken from one of your blisters and sent to a lab. This will show if you have shingles. How is this treated? The rash may last for several weeks. There's no cure for shingles, but your health care provider may give you medicines.  These medicines may: Help with pain. Help with itching. Help with irritation and swelling. Help you get better sooner. Help to prevent long-term problems. If the rash is on your face, you may need to see an eye doctor or an ear, nose, and throat (ENT) doctor. Follow these instructions at home: Medicines Take your medicines only as told by your provider. Put an anti-itch cream or numbing cream on the rash or blisters as told by your provider. Relieving itching and discomfort  To help with itching: Put cold, wet cloths called cold compresses on the rash or blisters. Take a cool bath. Try adding baking soda or dry oatmeal to the water. Do not bathe in hot water. Use calamine lotion on the rash or blisters. You can get this type of lotion at the store. Blister and rash care Keep your rash covered with a loose bandage. Wear loose clothes that don't rub on your rash. Take care of your rash as told by your provider. Make sure you: Wash your hands with soap and water for at least 20 seconds before and after you change your bandage. If you can't use soap and water, use hand sanitizer. Keep your rash and blisters clean by washing them with mild soap and cool water. Change your bandage. Check your rash every day for signs of infection. Check for: More redness, swelling, or pain. Fluid or blood. Warmth. Pus or a bad smell. Do not scratch your rash. Do not pick at your  blisters. To help you not scratch: Keep your fingernails clean and cut short. Try to wear gloves or mittens when you sleep. General instructions Rest. Wash your hands often with soap and water for at least 20 seconds. If you can't use soap and water, use hand sanitizer. Washing your hands lowers your chance of getting a skin infection. Your infection can cause chickenpox in others. If you have blisters that aren't scabs yet, stay away from: Babies. Pregnant people. Children who have eczema. Older people who have organ  transplants. People who have a long-term, or chronic, illness. Anyone who hasn't had chickenpox before. Anyone who hasn't gotten the chickenpox vaccine. How is this prevented? Vaccines are the best way to prevent you from getting chickenpox or shingles. Talk with your provider about getting these shots. Where to find more information Centers for Disease Control and Prevention (CDC): TonerPromos.no Contact a health care provider if: Your pain doesn't get better with medicine. Your pain doesn't get better after the rash heals. You have any signs of infection around the rash. Your rash or blisters get worse. You have a fever or chills. Get help right away if: The rash is on your face or nose. You have pain in your face or by your eye. You lose feeling on one side of your face. You have trouble seeing. You have ear pain or ringing in your ear. This information is not intended to replace advice given to you by your health care provider. Make sure you discuss any questions you have with your health care provider. Document Revised: 02/18/2023 Document Reviewed: 07/03/2022 Elsevier Patient Education  2024 [employer].

## 2023-08-17 NOTE — Progress Notes (Signed)
   Acute Office Visit  Subjective:     Patient ID: Hailey Sheppard, female    DOB: 01-Mar-1966, 58 y.o.   MRN: [id number]  Chief Complaint  Patient presents with   Rash    Top of head possible shingles 4 days, headache    HPI Patient is in today for rash.  She first noticed an itching sensation on the top of her head about a week ago but noticed a red vesicular rash 4 days ago.  It is on the right side of her head going into her forehead and down towards her ear.  Rash is more itchy but does have a burning sensation.  She denies any drainage but the rash is spreading.  No fevers.  Review of Systems  Skin:  Positive for itching and rash.        Objective:    BP 126/72 (Cuff Size: Large)   Pulse 100   Temp 98.3 F (36.8 C) (Oral)   Resp 16   Ht 5\' 2"  (1.575 m)   Wt 232 lb 11.2 oz (105.6 kg)   LMP 01/21/2018 (Exact Date)   SpO2 96%   BMI 42.56 kg/m    Physical Exam Constitutional:      Appearance: Normal appearance.  HENT:     Head: Normocephalic and atraumatic.  Eyes:     Conjunctiva/sclera: Conjunctivae normal.  Cardiovascular:     Rate and Rhythm: Normal rate and regular rhythm.  Pulmonary:     Effort: Pulmonary effort is normal.     Breath sounds: Normal breath sounds.  Skin:    General: Skin is warm and dry.     Findings: Rash present.     Comments: Erythematous and vesicular rash consistent with shingles on the right side of her scalp, forehead and temple area  Neurological:     General: No focal deficit present.     Mental Status: She is alert. Mental status is at baseline.  Psychiatric:        Mood and Affect: Mood normal.        Behavior: Behavior normal.     No results found for any visits on 08/17/23.      Assessment & Plan:   Problem List Items Addressed This Visit   None Visit Diagnoses       Herpes zoster with complication    -  Primary   Relevant Medications   valACYclovir (VALTREX) 1000 MG tablet       Meds ordered this encounter   Medications   valACYclovir (VALTREX) 1000 MG tablet    Sig: Take 1 tablet (1,000 mg total) by mouth 3 (three) times daily for 10 days.    Dispense:  30 tablet    Refill:  0   Treat with valacyclovir 1 g 3 times daily x 10 days.  Patient works with a lot of immunocompromised coworkers, will give a work note for the rest of the week.  Printed information about shingles for the patient, discussed obtaining the shingles vaccine once infection clears.  Patient will let me know if rash continues to spread or moves closer to her eye.   Return in about 2 months (around 10/17/2023).  Margarita Mail, DO

## 2023-08-23 ENCOUNTER — Encounter: Payer: Self-pay | Admitting: Internal Medicine

## 2023-10-22 ENCOUNTER — Encounter: Payer: Self-pay | Admitting: Internal Medicine

## 2023-10-22 ENCOUNTER — Other Ambulatory Visit: Payer: Self-pay | Admitting: Internal Medicine

## 2023-10-22 ENCOUNTER — Other Ambulatory Visit: Payer: Self-pay

## 2023-10-22 ENCOUNTER — Ambulatory Visit: Admitting: Internal Medicine

## 2023-10-22 VITALS — BP 120/80 | HR 98 | Temp 98.3°F | Resp 16 | Ht 62.0 in | Wt 234.9 lb

## 2023-10-22 DIAGNOSIS — Z6841 Body Mass Index (BMI) 40.0 and over, adult: Secondary | ICD-10-CM

## 2023-10-22 DIAGNOSIS — M160 Bilateral primary osteoarthritis of hip: Secondary | ICD-10-CM | POA: Diagnosis not present

## 2023-10-22 DIAGNOSIS — R7303 Prediabetes: Secondary | ICD-10-CM

## 2023-10-22 DIAGNOSIS — E66813 Obesity, class 3: Secondary | ICD-10-CM

## 2023-10-22 DIAGNOSIS — E782 Mixed hyperlipidemia: Secondary | ICD-10-CM | POA: Diagnosis not present

## 2023-10-22 LAB — POCT GLYCOSYLATED HEMOGLOBIN (HGB A1C): Hemoglobin A1C: 6.1 % — AB (ref 4.0–5.6)

## 2023-10-22 MED ORDER — ZEPBOUND 2.5 MG/0.5ML ~~LOC~~ SOAJ: 2.5000 mg | SUBCUTANEOUS | 0 refills | Status: DC

## 2023-10-22 NOTE — Progress Notes (Signed)
 Established Patient Office Visit  Subjective   Patient ID: Hailey Sheppard, female    DOB: 03/12/1966  Age: 58 y.o. MRN: [id number]  Chief Complaint  Patient presents with   Medical Management of Chronic Issues    2 month follow up    Obesity    HPI  Patient is here to follow up on chronic medical conditions.   Discussed the use of AI scribe software for clinical note transcription with the patient, who gave verbal consent to proceed.  History of Present Illness Hailey Sheppard is a 58 year old female with prediabetes and hyperlipidemia who presents for follow-up on her A1c and cholesterol levels.  Her A1c was 6.5% in January, indicating prediabetes. She monitors her sugar intake and uses low-sugar alternatives, occasionally consuming sweets. She reports feeling better with no high blood sugar symptoms.  Her cholesterol levels show a total cholesterol of 224 mg/dL and triglycerides at 409 mg/dL. HDL is 52 mg/dL, and LDL is elevated. She is interested in dietary changes and healthier cooking methods to manage her cholesterol.  A torn ligament has limited her physical activity for six months, but she is now able to walk more and considers increasing exercise to manage cholesterol and weight.  She has a family history of cardiovascular disease, with her mother recently experiencing a heart attack. She is concerned about her weight, with a BMI of 42, and aims to lose weight to reduce joint stress, particularly on her left hip. She identifies as a stress eater and has struggled with weight since menopause.    Pre-Diabetes: -Last A1c 1/25 6.5% but fasting glucose appropriate -Not on medications but interested in discussing weight loss medications  HLD: -Medications: Nothing -Patient's mother just passed away a few weeks ago from MI -Last lipid panel: Lipid Panel     Component Value Date/Time   CHOL 224 (H) 07/02/2023 1310   TRIG 175 (H) 07/02/2023 1310   HDL 52 07/02/2023 1310    CHOLHDL 4.3 07/02/2023 1310   CHOLHDL 3.8 03/02/2018 1236   VLDL 23 12/16/2016 1220   LDLCALC 141 (H) 07/02/2023 1310   LDLCALC 138 (H) 03/02/2018 1236   LABVLDL 31 07/02/2023 1310       Patient Active Problem List   Diagnosis Date Noted   Leg pain 07/30/2020   S/P laparoscopic hysterectomy 03/11/2020   Intramural and subserous leiomyoma of uterus    Pelvic pain    Genetic carrier of heritable cancer 08/04/2018   Dysmenorrhea 07/07/2018   Fibroids, submucosal 07/07/2018   Effusion of left knee joint 03/07/2018   Degenerative arthritis of left knee 03/07/2018   Degenerative arthritis of right knee 03/07/2018   Preventative health care 12/16/2016   Allergy 05/27/2016   GERD (gastroesophageal reflux disease) 05/27/2016   Past Medical History:  Diagnosis Date   Allergy    Anxiety    Arthritis    bil knees   Complication of anesthesia    pt panics easily feeling like she cant breathe would like to be up some not complete flat    Family history of ovarian cancer    GERD (gastroesophageal reflux disease)    Headache    occ-migraines   Hypertension    PCP prescribed pt bp med but pt never took it   PMS2-related Lynch syndrome (HNPCC4) 07/2018   MyRisk testing positive for PMS2 mutation   Past Surgical History:  Procedure Laterality Date   APPENDECTOMY  1980   Open with Bowel Resection due to  Perforation   CHOLECYSTECTOMY N/A 06/17/2016   Procedure: LAPAROSCOPIC CHOLECYSTECTOMY;  Surgeon: Claudia Cuff, MD;  Location: [facility];  Service: General;  Laterality: N/A;   COLON SURGERY  1985   Bowel Resection during Appendectomy secondary to Perforation   CYSTOSCOPY  02/29/2020   Procedure: CYSTOSCOPY;  Surgeon: Alben Alma, MD;  Location: [facility];  Service: Gynecology;;   DILATATION & CURETTAGE/HYSTEROSCOPY WITH TRUECLEAR N/A 07/07/2018   Procedure: DILATATION & CURETTAGE/HYSTEROSCOPY WITH TRUCLEAR;  Surgeon: Alben Alma, MD;  Location: [facility];  Service:  Gynecology;  Laterality: N/A;   GALLBLADDER SURGERY     januray 17th 2018   TOTAL LAPAROSCOPIC HYSTERECTOMY WITH BILATERAL SALPINGO OOPHORECTOMY Bilateral 02/29/2020   Procedure: TOTAL LAPAROSCOPIC HYSTERECTOMY WITH BILATERAL SALPINGO OOPHORECTOMY;  Surgeon: Alben Alma, MD;  Location: [facility];  Service: Gynecology;  Laterality: Bilateral;   Social History   Tobacco Use   Smoking status: Never    Passive exposure: Never   Smokeless tobacco: Never  Vaping Use   Vaping status: Never Used  Substance Use Topics   Alcohol use: Not Currently    Comment: rare   Drug use: No   Social History   Socioeconomic History   Marital status: Married    Spouse name: Siegfried Dress   Number of children: Not on file   Years of education: 12   Highest education level: High school graduate  Occupational History   Occupation: [employer]  Tobacco Use   Smoking status: Never    Passive exposure: Never   Smokeless tobacco: Never  Vaping Use   Vaping status: Never Used  Substance and Sexual Activity   Alcohol use: Not Currently    Comment: rare   Drug use: No   Sexual activity: Yes  Other Topics Concern   Not on file  Social History Narrative   Not on file   Social Drivers of Health   Financial Resource Strain: Low Risk  (06/18/2023)   Overall Financial Resource Strain (CARDIA)    Difficulty of Paying Living Expenses: Not hard at all  Food Insecurity: No Food Insecurity (06/18/2023)   Hunger Vital Sign    Worried About Running Out of Food in the Last Year: Never true    Ran Out of Food in the Last Year: Never true  Transportation Needs: No Transportation Needs (06/18/2023)   PRAPARE - Administrator, Civil Service (Medical): No    Lack of Transportation (Non-Medical): No  Physical Activity: Inactive (06/18/2023)   Exercise Vital Sign    Days of Exercise per Week: 0 days    Minutes of Exercise per Session: 0 min  Stress: No Stress Concern Present (06/18/2023)   [employer] of Occupational Health - Occupational Stress Questionnaire    Feeling of Stress : Not at all  Social Connections: Moderately Isolated (06/18/2023)   Social Connection and Isolation Panel [NHANES]    Frequency of Communication with Friends and Family: More than three times a week    Frequency of Social Gatherings with Friends and Family: Once a week    Attends Religious Services: Never    Database administrator or Organizations: No    Attends Banker Meetings: Never    Marital Status: Married  Catering manager Violence: Not At Risk (06/18/2023)   Humiliation, Afraid, Rape, and Kick questionnaire    Fear of Current or Ex-Partner: No    Emotionally Abused: No    Physically Abused: No    Sexually Abused: No  Family Status  Relation Name Status   Mother  Alive   Father  Alive   Brother  Alive   MGM  Deceased       ovarian cancer   MGF  Deceased       kidney cancer   PGM  Deceased       stroke  No partnership data on file   Family History  Problem Relation Age of Onset   Diabetes Mother    Obesity Mother    Diabetes Father    Gallbladder disease Brother    Ovarian cancer Maternal Grandmother 51   Heart disease Maternal Grandfather    Gallbladder disease Maternal Grandfather    Stroke Paternal Grandmother    Kidney cancer Paternal Grandmother    Allergies  Allergen Reactions   Erythromycin Nausea And Vomiting    Review of Systems  All other systems reviewed and are negative.     Objective:     BP 120/80 (Cuff Size: Large)   Pulse 98   Temp 98.3 F (36.8 C) (Oral)   Resp 16   Ht 5\' 2"  (1.575 m)   Wt 234 lb 14.4 oz (106.5 kg)   LMP 01/21/2018 (Exact Date)   SpO2 99%   BMI 42.96 kg/m  BP Readings from Last 3 Encounters:  10/22/23 120/80  08/17/23 126/72  07/18/23 (!) 168/88   Wt Readings from Last 3 Encounters:  10/22/23 234 lb 14.4 oz (106.5 kg)  08/17/23 232 lb 11.2 oz (105.6 kg)  07/18/23 237 lb (107.5 kg)      Physical  Exam Constitutional:      Appearance: Normal appearance.  HENT:     Head: Normocephalic and atraumatic.  Eyes:     Conjunctiva/sclera: Conjunctivae normal.  Cardiovascular:     Rate and Rhythm: Normal rate and regular rhythm.  Pulmonary:     Effort: Pulmonary effort is normal.     Breath sounds: Normal breath sounds.  Skin:    General: Skin is warm and dry.  Neurological:     General: No focal deficit present.     Mental Status: She is alert. Mental status is at baseline.  Psychiatric:        Mood and Affect: Mood normal.        Behavior: Behavior normal.      No results found for any visits on 10/22/23.  Last CBC Lab Results  Component Value Date   WBC 6.9 07/02/2023   HGB 13.1 07/02/2023   HCT 41.4 07/02/2023   MCV 91 07/02/2023   MCH 28.9 07/02/2023   RDW 13.9 07/02/2023   PLT 251 07/02/2023   Last metabolic panel Lab Results  Component Value Date   GLUCOSE 85 07/02/2023   NA 141 07/02/2023   K 4.4 07/02/2023   CL 103 07/02/2023   CO2 23 07/02/2023   BUN 10 07/02/2023   CREATININE 0.68 07/02/2023   EGFR 101 07/02/2023   CALCIUM 9.5 07/02/2023   PROT 7.2 07/02/2023   ALBUMIN 4.3 07/02/2023   LABGLOB 2.9 07/02/2023   BILITOT <0.2 07/02/2023   ALKPHOS 90 07/02/2023   AST 16 07/02/2023   ALT 17 07/02/2023   ANIONGAP 6 08/09/2021   Last lipids Lab Results  Component Value Date   CHOL 224 (H) 07/02/2023   HDL 52 07/02/2023   LDLCALC 141 (H) 07/02/2023   TRIG 175 (H) 07/02/2023   CHOLHDL 4.3 07/02/2023   Last hemoglobin A1c Lab Results  Component Value Date   HGBA1C 6.1 (  A) 10/22/2023   Last thyroid functions Lab Results  Component Value Date   TSH 0.790 07/02/2023   Last vitamin D No results found for: "25OHVITD2", "25OHVITD3", "VD25OH" Last vitamin B12 and Folate No results found for: "VITAMINB12", "FOLATE"    The 10-year ASCVD risk score (Arnett DK, et al., 2019) is: 2.7%    Assessment & Plan:   Assessment & Plan Obesity BMI 42.  Discussed weight loss options including lifestyle changes and GLP-1 receptor agonists. Explained potential side effects and insurance issues. - Submit prescription for Zepbound to assess insurance coverage. - Consider Wegovy if Zepbound not covered. - Encourage lifestyle modifications including diet and exercise. - Schedule follow-up three weeks after starting medication if approved.  Osteoarthritis Weight loss anticipated to alleviate joint pain and improve mobility. - Encourage weight loss to reduce joint stress and improve mobility.  Prediabetes A1c improved from 6.5% to 6.1%. Fasting blood sugar normal. Discussed insulin resistance and lifestyle changes to prevent type 2 diabetes. - Encourage dietary modifications to reduce sugar intake. - Discuss potential use of GLP-1 receptor agonists for weight management and insulin sensitivity improvement.  Hyperlipidemia Total cholesterol 224 mg/dL, triglycerides 409 mg/dL, LDL elevated. Cardiovascular risk low at 2.7% over ten years. Discussed dietary influences and genetic predisposition. - Encourage dietary changes to reduce intake of red meats, fried foods, and processed foods. - Recommend use of an air fryer to reduce oil consumption. - Consider fish oil supplements to help lower triglycerides. - Plan to recheck cholesterol levels in one year.  - POCT HgB A1C - tirzepatide (ZEPBOUND) 2.5 MG/0.5ML Pen; Inject 2.5 mg into the skin once a week.  Dispense: 2 mL; Refill: 0   Return for will call to schedule.    Rockney Cid, DO

## 2023-10-26 NOTE — Telephone Encounter (Signed)
 Requested medication (s) are due for refill today:   Provider to review  Requested medication (s) are on the active medication list:   See pharmacy note.   An alternative is being requested because insurance will not cover.  Future visit scheduled:      Last ordered:  An alternative is being requested.   Requested Prescriptions  Pending Prescriptions Disp Refills   phentermine 15 MG capsule [Pharmacy Med Name: PHENTERMINE 15 MG CAPSULE]  0     Not Delegated - Neurology: Anticonvulsants - Controlled - phentermine hydrochloride Failed - 10/26/2023  3:22 PM      Failed - This refill cannot be delegated      Passed - eGFR in normal range and within 360 days    GFR, Est African American  Date Value Ref Range Status  03/02/2018 93 > OR = 60 mL/min/1.70m2 Final   GFR, Est Non African American  Date Value Ref Range Status  03/02/2018 81 > OR = 60 mL/min/1.96m2 Final   GFR, Estimated  Date Value Ref Range Status  08/09/2021 >60 >60 mL/min Final    Comment:    (NOTE) Calculated using the CKD-EPI Creatinine Equation (2021)    eGFR  Date Value Ref Range Status  07/02/2023 101 >59 mL/min/1.73 Final         Passed - Cr in normal range and within 360 days    Creat  Date Value Ref Range Status  03/02/2018 0.83 0.50 - 1.05 mg/dL Final    Comment:    For patients >36 years of age, the reference limit for Creatinine is approximately 13% higher for people identified as African-American. .    Creatinine, Ser  Date Value Ref Range Status  07/02/2023 0.68 0.57 - 1.00 mg/dL Final         Passed - Last BP in normal range    BP Readings from Last 1 Encounters:  10/22/23 120/80         Passed - Valid encounter within last 6 months    Recent Outpatient Visits           4 days ago Class 3 severe obesity with serious comorbidity and body mass index (BMI) of 40.0 to 44.9 in adult, unspecified obesity type   [facility] Rockney Cid, DO   2 months ago  Herpes zoster with complication   [facility] Rockney Cid, DO              Passed - Weight completed in the last 3 months    Wt Readings from Last 1 Encounters:  10/22/23 234 lb 14.4 oz (106.5 kg)

## 2023-12-21 ENCOUNTER — Other Ambulatory Visit (HOSPITAL_COMMUNITY): Payer: Self-pay

## 2023-12-24 ENCOUNTER — Ambulatory Visit
Admission: RE | Admit: 2023-12-24 | Discharge: 2023-12-24 | Disposition: A | Discharge status: Home / Self Care | Source: Ambulatory Visit | Attending: Internal Medicine | Admitting: Internal Medicine

## 2023-12-24 DIAGNOSIS — Z1231 Encounter for screening mammogram for malignant neoplasm of breast: Secondary | ICD-10-CM | POA: Diagnosis not present

## 2023-12-24 DIAGNOSIS — Z0000 Encounter for general adult medical examination without abnormal findings: Secondary | ICD-10-CM | POA: Diagnosis not present

## 2023-12-28 NOTE — Telephone Encounter (Signed)
 Error

## 2023-12-29 ENCOUNTER — Other Ambulatory Visit: Payer: Self-pay | Admitting: Internal Medicine

## 2023-12-29 DIAGNOSIS — R928 Other abnormal and inconclusive findings on diagnostic imaging of breast: Secondary | ICD-10-CM

## 2024-01-06 DIAGNOSIS — R102 Pelvic and perineal pain: Secondary | ICD-10-CM | POA: Diagnosis not present

## 2024-01-06 DIAGNOSIS — R350 Frequency of micturition: Secondary | ICD-10-CM | POA: Diagnosis not present

## 2024-01-07 ENCOUNTER — Ambulatory Visit
Admission: RE | Admit: 2024-01-07 | Discharge: 2024-01-07 | Disposition: A | Discharge status: Home / Self Care | Source: Ambulatory Visit | Attending: Internal Medicine | Admitting: Internal Medicine

## 2024-01-07 DIAGNOSIS — R928 Other abnormal and inconclusive findings on diagnostic imaging of breast: Secondary | ICD-10-CM

## 2024-01-07 DIAGNOSIS — R92323 Mammographic fibroglandular density, bilateral breasts: Secondary | ICD-10-CM | POA: Diagnosis not present

## 2024-01-10 ENCOUNTER — Ambulatory Visit: Payer: Self-pay | Admitting: Internal Medicine

## 2024-06-12 ENCOUNTER — Other Ambulatory Visit: Payer: Self-pay | Admitting: Internal Medicine

## 2024-06-12 ENCOUNTER — Other Ambulatory Visit: Payer: Self-pay

## 2024-06-12 ENCOUNTER — Encounter: Payer: Self-pay | Admitting: Internal Medicine

## 2024-06-12 ENCOUNTER — Telehealth: Payer: Self-pay | Admitting: Internal Medicine

## 2024-06-12 DIAGNOSIS — E66813 Obesity, class 3: Secondary | ICD-10-CM

## 2024-06-12 DIAGNOSIS — E782 Mixed hyperlipidemia: Secondary | ICD-10-CM

## 2024-06-12 DIAGNOSIS — Z6841 Body Mass Index (BMI) 40.0 and over, adult: Secondary | ICD-10-CM

## 2024-06-12 DIAGNOSIS — R7303 Prediabetes: Secondary | ICD-10-CM

## 2024-06-12 MED ORDER — ZEPBOUND 2.5 MG/0.5ML ~~LOC~~ SOAJ: 2.5000 mg | SUBCUTANEOUS | 0 refills | Status: AC

## 2024-06-12 NOTE — Progress Notes (Signed)
 Virtual Visit via Video Note  I connected with Hailey Sheppard on 06/12/2024 at  2:20 PM EST by a video enabled telemedicine application and verified that I am speaking with the correct person using two identifiers.  Location: Patient: Work Provider: [facility]   I discussed the limitations of evaluation and management by telemedicine and the availability of in person appointments. The patient expressed understanding and agreed to proceed.  History of Present Illness:  Discussed the use of AI scribe software for clinical note transcription with the patient, who gave verbal consent to proceed.  History of Present Illness Hailey Sheppard is a 59 year old female who presents for a follow-up regarding weight loss medication.  She is interested in starting Zepbound  for weight loss and has not started any weight loss medication yet. She previously discussed Zepbound  with her provider around May but did not start it because of insurance coverage. She recently changed her Blue [employer] Blue Shield plan and updated her insurance so she can pursue treatment now.  She has prediabetes and elevated cholesterol. Her weight was 236 pounds in May and is unchanged as of a few days ago.  She asks about oral options. She understands Zepbound  is only injectable at this time and that there are other oral options such as Rybelsus, which is used mainly for A1c control rather than weight loss.   Observations/Objective:  General: well appearing, no acute distress ENT: conjunctiva normal appearing bilaterally  Skin: no rashes, cyanosis or abnormal bruising noted Neuro: answers all questions appropriately   Assessment and Plan:  Assessment & Plan Class 3 obesity with comorbid prediabetes and mixed hyperlipidemia Class 3 obesity with prediabetes and mixed hyperlipidemia. Previous Zepbound  attempt failed due to insurance. Current insurance is Blue Cross Blue Shield. Zepbound  is a weekly injection with potential side effects  including pancreatitis, nausea, vomiting, acid reflux, and constipation. Oral alternatives are unavailable or not covered. Rybelsus is less effective for weight loss. Labs due next month for monitoring. - Prescribed Zepbound  2.5 mg, 4 pens for one month, linked with prediabetes and hyperlipidemia for insurance. - Sent prescription to [facility]. - Scheduled follow-up in one month for medication review and labs. - Advised fasting for follow-up. - Discussed potential need for prior authorization if insurance denies. - Consider alternative oral medications if Zepbound  not covered.  - tirzepatide  (ZEPBOUND ) 2.5 MG/0.5ML Pen; Inject 2.5 mg into the skin once a week.  Dispense: 2 mL; Refill: 0   Follow Up Instructions: 1 month     I discussed the assessment and treatment plan with the patient. The patient was provided an opportunity to ask questions and all were answered. The patient agreed with the plan and demonstrated an understanding of the instructions.   The patient was advised to call back or seek an in-person evaluation if the symptoms worsen or if the condition fails to improve as anticipated.  I provided 10 minutes of non-face-to-face time during this encounter.   Sharyle Fischer, DO

## 2024-06-13 NOTE — Telephone Encounter (Signed)
 Requested medication (s) are due for refill today - no  Requested medication (s) are on the active medication list -no  Future visit scheduled -yes  Last refill: 06/12/24  Notes to clinic:  Pharmacy comment: Alternative Requested:THE PRESCRIBED MEDICATION IS NOT COVERED BY INSURANCE. PLEASE CONSIDER CHANGING TO ONE OF THE SUGGESTED COVERED ALTERNATIVES.   All Pharmacy Suggested Alternatives:  Phentermine-Topiramate ER 3.75-23 MG CP24 phentermine 15 MG capsule Diethylpropion HCl 25 MG TABS    Requested Prescriptions  Pending Prescriptions Disp Refills   Phentermine-Topiramate ER 3.75-23 MG CP24 [Pharmacy Med Name: PHENTERMINE-TOPIR ER 3.75-23MG ]  0     Not Delegated - Neurology: Anticonvulsants - Controlled - phentermine / topiramate Failed - 06/13/2024  2:25 PM      Failed - This refill cannot be delegated      Failed - Completed PHQ-2 or PHQ-9 in the last 360 days      Passed - Cr in normal range and within 360 days    Creat  Date Value Ref Range Status  03/02/2018 0.83 0.50 - 1.05 mg/dL Final    Comment:    For patients >40 years of age, the reference limit for Creatinine is approximately 13% higher for people identified as African-American. .    Creatinine, Ser  Date Value Ref Range Status  07/02/2023 0.68 0.57 - 1.00 mg/dL Final         Passed - CO2 in normal range and within 360 days    CO2  Date Value Ref Range Status  07/02/2023 23 20 - 29 mmol/L Final         Passed - ALT in normal range and within 360 days    ALT  Date Value Ref Range Status  07/02/2023 17 0 - 32 IU/L Final         Passed - AST in normal range and within 360 days    AST  Date Value Ref Range Status  07/02/2023 16 0 - 40 IU/L Final         Passed - Glucose (serum) in normal range and within 360 days    Glucose  Date Value Ref Range Status  07/02/2023 85 70 - 99 mg/dL Final   Glucose, Bld  Date Value Ref Range Status  08/09/2021 119 (H) 70 - 99 mg/dL Final    Comment:     Glucose reference range applies only to samples taken after fasting for at least 8 hours.   Glucose-Capillary  Date Value Ref Range Status  07/07/2018 65 (L) 70 - 99 mg/dL Final         Passed - K in normal range and within 360 days    Potassium  Date Value Ref Range Status  07/02/2023 4.4 3.5 - 5.2 mmol/L Final         Passed - Patient is not pregnant      Passed - Last BP in normal range    BP Readings from Last 1 Encounters:  10/22/23 120/80         Passed - Last Heart Rate in normal range    Pulse Readings from Last 1 Encounters:  10/22/23 98         Passed - Valid encounter within last 6 months    Recent Outpatient Visits           Yesterday Morbid obesity [facility])   [place] [facility] Bernardo Fend, DO   7 months ago Class 3 severe obesity with serious comorbidity and body mass index (  BMI) of 40.0 to 44.9 in adult, unspecified obesity type   [facility] Bernardo Fend, DO   10 months ago Herpes zoster with complication   [facility] Bernardo Fend, DO                 Requested Prescriptions  Pending Prescriptions Disp Refills   Phentermine-Topiramate ER 3.75-23 MG CP24 [Pharmacy Med Name: PHENTERMINE-TOPIR ER 3.75-23MG ]  0     Not Delegated - Neurology: Anticonvulsants - Controlled - phentermine / topiramate Failed - 06/13/2024  2:25 PM      Failed - This refill cannot be delegated      Failed - Completed PHQ-2 or PHQ-9 in the last 360 days      Passed - Cr in normal range and within 360 days    Creat  Date Value Ref Range Status  03/02/2018 0.83 0.50 - 1.05 mg/dL Final    Comment:    For patients >20 years of age, the reference limit for Creatinine is approximately 13% higher for people identified as African-American. .    Creatinine, Ser  Date Value Ref Range Status  07/02/2023 0.68 0.57 - 1.00 mg/dL Final         Passed - CO2 in normal range and within 360  days    CO2  Date Value Ref Range Status  07/02/2023 23 20 - 29 mmol/L Final         Passed - ALT in normal range and within 360 days    ALT  Date Value Ref Range Status  07/02/2023 17 0 - 32 IU/L Final         Passed - AST in normal range and within 360 days    AST  Date Value Ref Range Status  07/02/2023 16 0 - 40 IU/L Final         Passed - Glucose (serum) in normal range and within 360 days    Glucose  Date Value Ref Range Status  07/02/2023 85 70 - 99 mg/dL Final   Glucose, Bld  Date Value Ref Range Status  08/09/2021 119 (H) 70 - 99 mg/dL Final    Comment:    Glucose reference range applies only to samples taken after fasting for at least 8 hours.   Glucose-Capillary  Date Value Ref Range Status  07/07/2018 65 (L) 70 - 99 mg/dL Final         Passed - K in normal range and within 360 days    Potassium  Date Value Ref Range Status  07/02/2023 4.4 3.5 - 5.2 mmol/L Final         Passed - Patient is not pregnant      Passed - Last BP in normal range    BP Readings from Last 1 Encounters:  10/22/23 120/80         Passed - Last Heart Rate in normal range    Pulse Readings from Last 1 Encounters:  10/22/23 98         Passed - Valid encounter within last 6 months    Recent Outpatient Visits           Yesterday Morbid obesity [facility])   [place] [facility] Bernardo Fend, DO   7 months ago Class 3 severe obesity with serious comorbidity and body mass index (BMI) of 40.0 to 44.9 in adult, unspecified obesity type   [facility] Bernardo Fend, DO   10 months ago  Herpes zoster with complication   [facility] Bernardo Fend, [place]

## 2024-06-14 ENCOUNTER — Other Ambulatory Visit (HOSPITAL_COMMUNITY): Payer: Self-pay

## 2024-06-15 ENCOUNTER — Telehealth: Payer: Self-pay

## 2024-06-15 ENCOUNTER — Other Ambulatory Visit (HOSPITAL_COMMUNITY): Payer: Self-pay

## 2024-06-15 NOTE — Telephone Encounter (Signed)
 Pharmacy Patient Advocate Encounter   Received notification from Pt Calls Messages that prior authorization for Zepbound  2.5mg /0.13ml is required/requested.   Insurance verification completed.   The patient is insured through [facility].   Per test claim:  Liraglutide injection 18mg /71ml, Phentermine/Topiramate, Phentermine 15mg  caps, Diethylpropion is preferred by the insurance.  If suggested medication is appropriate, Please send in a new RX and discontinue this one. If not, please advise as to why it's not appropriate so that we may request a Prior Authorization. Please note, some preferred medications may still require a PA.  If the suggested medications have not been trialed and there are no contraindications to their use, the PA will not be submitted, as it will not be approved. Archived Key: [id number]

## 2024-06-16 ENCOUNTER — Other Ambulatory Visit: Payer: Self-pay | Admitting: Internal Medicine

## 2024-06-16 ENCOUNTER — Other Ambulatory Visit (HOSPITAL_COMMUNITY): Payer: Self-pay

## 2024-06-16 DIAGNOSIS — E782 Mixed hyperlipidemia: Secondary | ICD-10-CM

## 2024-06-16 DIAGNOSIS — R7303 Prediabetes: Secondary | ICD-10-CM

## 2024-06-16 NOTE — Telephone Encounter (Signed)
 Requested medications are due for refill today.  See note  Requested medications are on the active medications list.  yes  Last refill. 1/12/206  Future visit scheduled.   yes  Notes to clinic.  Pharmacy comment: Alternative Requested:THE PRESCRIBED MEDICATION IS NOT COVERED BY INSURANCE. PLEASE CONSIDER CHANGING TO ONE OF THE SUGGESTED COVERED ALTERNATIVES.     Requested Prescriptions  Pending Prescriptions Disp Refills   Liraglutide -Weight Management 18 MG/3ML SOPN [Pharmacy Med Name: LIRAGLUTIDE 5-PAK (SAXENDA)]  0     Endocrinology:  Diabetes - GLP-1 Receptor Agonists Failed - 06/16/2024  1:59 PM      Failed - HBA1C is between 0 and 7.9 and within 180 days    Hemoglobin A1C  Date Value Ref Range Status  10/22/2023 6.1 (A) 4.0 - 5.6 % Final   Hgb A1c MFr Bld  Date Value Ref Range Status  07/02/2023 6.5 (H) 4.8 - 5.6 % Final    Comment:             Prediabetes: 5.7 - 6.4          Diabetes: >6.4          Glycemic control for adults with diabetes: <7.0          Passed - Valid encounter within last 6 months    Recent Outpatient Visits           4 days ago Morbid obesity [facility])   [place] [facility] Bernardo Fend, DO   7 months ago Class 3 severe obesity with serious comorbidity and body mass index (BMI) of 40.0 to 44.9 in adult, unspecified obesity type   [facility] Bernardo Fend, DO   10 months ago Herpes zoster with complication   [facility] Bernardo Fend, [place]

## 2024-06-16 NOTE — Telephone Encounter (Signed)
 Not covered.

## 2024-07-21 ENCOUNTER — Ambulatory Visit: Admitting: Internal Medicine
